# Patient Record
Sex: Female | Born: 1945 | Race: White | Hispanic: No | Marital: Single | Smoking: Former smoker
Health system: Southern US, Community
[De-identification: ages and names within clinical notes are randomized; demographics above are authoritative.]

---

## 2016-11-10 ENCOUNTER — Encounter: Payer: Medicare Other | Attending: Physician Assistant | Admitting: Physician Assistant

## 2016-11-10 DIAGNOSIS — I10 Essential (primary) hypertension: Secondary | ICD-10-CM | POA: Diagnosis not present

## 2016-11-10 DIAGNOSIS — L03031 Cellulitis of right toe: Secondary | ICD-10-CM | POA: Diagnosis not present

## 2016-11-10 DIAGNOSIS — Z87891 Personal history of nicotine dependence: Secondary | ICD-10-CM | POA: Diagnosis not present

## 2016-11-10 DIAGNOSIS — E11621 Type 2 diabetes mellitus with foot ulcer: Secondary | ICD-10-CM | POA: Insufficient documentation

## 2016-11-10 DIAGNOSIS — L97512 Non-pressure chronic ulcer of other part of right foot with fat layer exposed: Secondary | ICD-10-CM | POA: Insufficient documentation

## 2016-11-10 DIAGNOSIS — M1990 Unspecified osteoarthritis, unspecified site: Secondary | ICD-10-CM | POA: Insufficient documentation

## 2016-11-10 DIAGNOSIS — Z7984 Long term (current) use of oral hypoglycemic drugs: Secondary | ICD-10-CM | POA: Insufficient documentation

## 2016-11-10 DIAGNOSIS — E1140 Type 2 diabetes mellitus with diabetic neuropathy, unspecified: Secondary | ICD-10-CM | POA: Insufficient documentation

## 2016-11-11 NOTE — Progress Notes (Signed)
Theresa Jensen, Arrielle ([id number]) Visit Report for 11/10/2016 Abuse/Suicide Risk Screen Details Patient Name: Theresa Jensen, Ruben 11/10/2016 12:45 Date of Service: PM Medical Record [id number] Number: Patient Account Number: [id number] 1946-04-25 (71 y.o. Treating RN: Afful, RN, BSN, [place] [facility] Date of Birth/Sex: Female) Other Clinician: Primary Care Heleena Miceli: TOBIN, REBECCA Treating STONE III, HOYT Referring Alanys Godino: Ruffin PyoBIN, REBECCA Cedrik Heindl/Extender: Weeks in Treatment: 0 Abuse/Suicide Risk Screen Items Answer ABUSE/SUICIDE RISK SCREEN: Has anyone close to you tried to hurt or harm you recentlyo No Do you feel uncomfortable with anyone in your familyo No Has anyone forced you do things that you didnot want to doo No Do you have any thoughts of harming yourselfo No Patient displays signs or symptoms of abuse and/or neglect. No Electronic Signature(s) Signed: 11/10/2016 4:42:39 PM By: Elpidio EricAfful, Rita BSN, RN Entered By: Elpidio EricAfful, Rita on 11/10/2016 13:00:06 Theresa Jensen, Cesia ([id number]) -------------------------------------------------------------------------------- Activities of Daily Living Details Patient Name: Theresa Jensen, Zerenity 11/10/2016 12:45 Date of Service: PM Medical Record [id number] Number: Patient Account Number: [id number] 1946-04-25 (71 y.o. Treating RN: Clover MealyAfful, RN, BSN, [place] [facility] Date of Birth/Sex: Female) Other Clinician: Primary Care Byrdie Miyazaki: TOBIN, REBECCA Treating STONE III, HOYT Referring Nazareth Kirk: Ruffin PyoBIN, REBECCA Genita Nilsson/Extender: Weeks in Treatment: 0 Activities of Daily Living Items Answer Activities of Daily Living (Please select one for each item) Drive Automobile Completely Able Take Medications Completely Able Use Telephone Completely Able Care for Appearance Completely Able Use Toilet Completely Able Bath / Shower Completely Able Dress Self Completely Able Feed Self Completely Able Walk Completely Able Get In / Out Bed Completely Able Housework Completely Able Prepare  Meals Completely Able Handle Money Completely Able Shop for Self Completely Able Electronic Signature(s) Signed: 11/10/2016 4:42:39 PM By: Elpidio EricAfful, Rita BSN, RN Entered By: Elpidio EricAfful, Rita on 11/10/2016 13:00:26 Theresa Jensen, Tacie ([id number]) -------------------------------------------------------------------------------- Education Assessment Details Patient Name: Theresa Jensen, Reigna 11/10/2016 12:45 Date of Service: PM Medical Record [id number] Number: Patient Account Number: [id number] 1946-04-25 (70 y.o. Treating RN: Clover MealyAfful, RN, BSN, [place] [facility] Date of Birth/Sex: Female) Other Clinician: Primary Care Trinty Marken: TOBIN, REBECCA Treating STONE III, HOYT Referring Ammarie Matsuura: Ruffin PyoBIN, REBECCA Alter Moss/Extender: Weeks in Treatment: 0 Primary Learner Assessed: Patient Learning Preferences/Education Level/Primary Language Learning Preference: Explanation Highest Education Level: High School Preferred Language: English Cognitive Barrier Assessment/Beliefs Language Barrier: No Physical Barrier Assessment Impaired Vision: No Impaired Hearing: No Decreased Hand dexterity: No Knowledge/Comprehension Assessment Knowledge Level: High Comprehension Level: High Ability to understand written High instructions: Ability to understand verbal High instructions: Motivation Assessment Anxiety Level: Calm Cooperation: Cooperative Education Importance: Acknowledges Need Interest in Health Problems: Asks Questions Perception: Coherent Willingness to Engage in Self- High Management Activities: Readiness to Engage in Self- High Management Activities: Electronic Signature(s) Signed: 11/10/2016 4:42:39 PM By: Elpidio EricAfful, Rita BSN, RN Entered By: Elpidio EricAfful, Rita on 11/10/2016 13:00:44 Theresa Jensen, Carlinda ([id number]) Theresa Jensen, Sanam ([id number]) -------------------------------------------------------------------------------- Fall Risk Assessment Details Patient Name: Theresa Jensen, Ivori 11/10/2016 12:45 Date of  Service: PM Medical Record [id number] Number: Patient Account Number: [id number] 1946-04-25 (71 y.o. Treating RN: Afful, RN, BSN, [place] [facility] Date of Birth/Sex: Female) Other Clinician: Primary Care Larico Dimock: TOBIN, REBECCA Treating STONE III, HOYT Referring Magda Muise: Ruffin PyoBIN, REBECCA Shatara Stanek/Extender: Weeks in Treatment: 0 Fall Risk Assessment Items Have you had 2 or more falls in the last 12 monthso 0 No Have you had any fall that resulted in injury in the last 12 monthso 0 No FALL RISK ASSESSMENT: History of falling - immediate or within 3 months 0 No Secondary diagnosis 0 No Ambulatory aid None/bed rest/wheelchair/nurse 0 Yes Crutches/cane/walker 0 No Furniture 0 No IV Access/Saline Lock 0 No Gait/Training  Normal/bed rest/immobile 0 Yes Weak 0 No Impaired 0 No Mental Status Oriented to own ability 0 Yes Electronic Signature(s) Signed: 11/10/2016 4:42:39 PM By: Elpidio Eric BSN, RN Entered By: Elpidio Eric on 11/10/2016 13:00:57 Theresa Rosales ([id number]) -------------------------------------------------------------------------------- Foot Assessment Details Patient Name: SHAKILA, MAK 11/10/2016 12:45 Date of Service: PM Medical Record [id number] Number: Patient Account Number: [id number] Aug 04, 1945 (71 y.o. Treating RN: Afful, RN, BSN, [place] [facility] Date of Birth/Sex: Female) Other Clinician: Primary Care Khori Underberg: TOBIN, REBECCA Treating STONE III, HOYT Referring Clarke Peretz: Ruffin Pyo Laury Huizar/Extender: Weeks in Treatment: 0 Foot Assessment Items Site Locations + = Sensation present, - = Sensation absent, C = Callus, U = Ulcer R = Redness, W = Warmth, M = Maceration, PU = Pre-ulcerative lesion F = Fissure, S = Swelling, D = Dryness Assessment Right: Left: Other Deformity: No No Prior Foot Ulcer: No No Prior Amputation: No No Charcot Joint: No No Ambulatory Status: Ambulatory Without Help Gait: Steady Electronic Signature(s) Signed: 11/10/2016 4:42:39 PM By: Elpidio Eric BSN, RN Entered By: Elpidio Eric on 11/10/2016 13:01:49 Theresa Rosales ([id number]) -------------------------------------------------------------------------------- Nutrition Risk Assessment Details Patient Name: SADIA, BELFIORE 11/10/2016 12:45 Date of Service: PM Medical Record [id number] Number: Patient Account Number: [id number] 12/29/1945 (71 y.o. Treating RN: Clover Mealy, RN, BSN, [place] [facility] Date of Birth/Sex: Female) Other Clinician: Primary Care Jakiah Goree: TOBIN, REBECCA Treating STONE III, HOYT Referring Carrington Olazabal: Ruffin Pyo Raymonda Pell/Extender: Weeks in Treatment: 0 Height (in): Weight (lbs): Body Mass Index (BMI): Nutrition Risk Assessment Items NUTRITION RISK SCREEN: I have an illness or condition that made me change the kind and/or 0 No amount of food I eat I eat fewer than two meals per day 0 No I eat few fruits and vegetables, or milk products 0 No I have three or more drinks of beer, liquor or wine almost every day 0 No I have tooth or mouth problems that make it hard for me to eat 0 No I don't always have enough money to buy the food I need 0 No I eat alone most of the time 0 No I take three or more different prescribed or over-the-counter drugs a 0 No day Without wanting to, I have lost or gained 10 pounds in the last six 0 No months I am not always physically able to shop, cook and/or feed myself 0 No Nutrition Protocols Good Risk Protocol 0 No interventions needed Moderate Risk Protocol Electronic Signature(s) Signed: 11/10/2016 4:42:39 PM By: Elpidio Eric BSN, RN Entered By: Elpidio Eric on 11/10/2016 13:01:02

## 2016-11-12 NOTE — Progress Notes (Signed)
Theresa RosalesSUTTON, Persais ([id number]) Visit Report for 11/10/2016 Allergy List Details Patient Name: Theresa RosalesSUTTON, Galya Date of Service: 11/10/2016 12:45 PM Medical Record Number: [id number] Patient Account Number: [id number] Date of Birth/Sex: 10-16-1945 [age] y.o. Female) Treating RN: Afful, RN, BSN, [place] Sinkita Primary Care Denny Mccree: Ruffin PyoBIN, REBECCA Other Clinician: Referring Khyri Hinzman: Ruffin PyoBIN, REBECCA Treating Joeli Fenner/Extender: Linwood DibblesSTONE III, HOYT Weeks in Treatment: 0 Allergies Active Allergies No known allergies Allergy Notes Electronic Signature(s) Signed: 11/10/2016 4:42:39 PM By: Elpidio EricAfful, Rita BSN, RN Entered By: Elpidio EricAfful, Rita on 11/10/2016 13:09:16 Theresa RosalesSUTTON, Kenadee ([id number]) -------------------------------------------------------------------------------- Arrival Information Details Patient Name: Theresa RosalesSUTTON, Elizet Date of Service: 11/10/2016 12:45 PM Medical Record Number: [id number] Patient Account Number: [id number] Date of Birth/Sex: 10-16-1945 [age] y.o. Female) Treating RN: Afful, RN, BSN, [place] Sinkita Primary Care Godric Lavell: Ruffin PyoBIN, REBECCA Other Clinician: Referring Dannia Snook: Ruffin PyoBIN, REBECCA Treating Lunden Stieber/Extender: Linwood DibblesSTONE III, HOYT Weeks in Treatment: 0 Visit Information Patient Arrived: Ambulatory Arrival Time: 12:57 Accompanied By: self Transfer Assistance: None Patient Identification Verified: Yes Secondary Verification Process Yes Completed: Patient Requires Transmission- No Based Precautions: Patient Has Alerts: Yes Patient Alerts: Patient on Blood Thinner Hgb A1C 8.2 (may) Electronic Signature(s) Signed: 11/10/2016 4:42:39 PM By: Elpidio EricAfful, Rita BSN, RN Entered By: Elpidio EricAfful, Rita on 11/10/2016 13:41:10 Theresa RosalesSUTTON, Keltie ([id number]) -------------------------------------------------------------------------------- Clinic Level of Care Assessment Details Patient Name: Theresa RosalesSUTTON, Latera Date of Service: 11/10/2016 12:45 PM Medical Record Number: [id number] Patient Account Number: [id number] Date of  Birth/Sex: 10-16-1945 (70 y.o. Female) Treating RN: Afful, RN, BSN,  Sinkita Primary Care Jamahl Lemmons: Ruffin PyoBIN, REBECCA Other Clinician: Referring Fraser Busche: Ruffin PyoBIN, REBECCA Treating Aerika Groll/Extender: Linwood DibblesSTONE III, HOYT Weeks in Treatment: 0 Clinic Level of Care Assessment Items TOOL 1 Quantity Score []  - Use when EandM and Procedure is performed on INITIAL visit 0 ASSESSMENTS - Nursing Assessment / Reassessment X - General Physical Exam (combine w/ comprehensive assessment (listed just 1 20 below) when performed on new pt. evals) X - Comprehensive Assessment (HX, ROS, Risk Assessments, Wounds Hx, etc.) 1 25 ASSESSMENTS - Wound and Skin Assessment / Reassessment []  - Dermatologic / Skin Assessment (not related to wound area) 0 ASSESSMENTS - Ostomy and/or Continence Assessment and Care []  - Incontinence Assessment and Management 0 []  - Ostomy Care Assessment and Management (repouching, etc.) 0 PROCESS - Coordination of Care X - Simple Patient / Family Education for ongoing care 1 15 []  - Complex (extensive) Patient / Family Education for ongoing care 0 X - Staff obtains Consents, Records, Test Results / Process Orders 1 10 []  - Staff telephones HHA, Nursing Homes / Clarify orders / etc 0 []  - Routine Transfer to another Facility (non-emergent condition) 0 []  - Routine Hospital Admission (non-emergent condition) 0 X - New Admissions / Manufacturing engineernsurance Authorizations / Ordering NPWT, Apligraf, etc. 1 15 []  - Emergency Hospital Admission (emergent condition) 0 PROCESS - Special Needs []  - Pediatric / Minor Patient Management 0 []  - Isolation Patient Management 0 Cale, Gavin PoundEBORAH ([id number]) []  - Hearing / Language / Visual special needs 0 []  - Assessment of Community assistance (transportation, D/C planning, etc.) 0 []  - Additional assistance / Altered mentation 0 []  - Support Surface(s) Assessment (bed, cushion, seat, etc.) 0 INTERVENTIONS - Miscellaneous []  - External ear exam 0 []  - Patient Transfer  (multiple staff / Nurse, adultHoyer Lift / Similar devices) 0 []  - Simple Staple / Suture removal (25 or less) 0 []  - Complex Staple / Suture removal (26 or more) 0 []  - Hypo/Hyperglycemic Management (do not check if billed separately) 0 X - Ankle / Brachial Index (ABI) - do not check if billed separately  1 15 Has the patient been seen at the hospital within the last three years: Yes Total Score: 100 Level Of Care: New/Established - Level 3 Electronic Signature(s) Signed: 11/10/2016 4:42:39 PM By: Elpidio Eric BSN, RN Entered By: Elpidio Eric on 11/10/2016 13:42:51 Theresa Jensen ([id number]) -------------------------------------------------------------------------------- Encounter Discharge Information Details Patient Name: Theresa Jensen Date of Service: 11/10/2016 12:45 PM Medical Record Number: [id number] Patient Account Number: [id number] Date of Birth/Sex: [birth year] (71 y.o. Female) Treating RN: Afful, RN, BSN, [place] Sink Primary Care Destynee Stringfellow: Ruffin Pyo Other Clinician: Referring Brodin Gelpi: Ruffin Pyo Treating Aerion Bagdasarian/Extender: Linwood Dibbles, HOYT Weeks in Treatment: 0 Encounter Discharge Information Items Discharge Pain Level: 0 Discharge Condition: Stable Ambulatory Status: Ambulatory Discharge Destination: Home Transportation: Private Auto Accompanied By: self Schedule Follow-up Appointment: No Medication Reconciliation completed and provided to Patient/Care No Zahriyah Joo: Provided on Clinical Summary of Care: 11/10/2016 Form Type Recipient Paper Patient DS Electronic Signature(s) Signed: 11/10/2016 4:42:39 PM By: Elpidio Eric BSN, RN Previous Signature: 11/10/2016 2:15:40 PM Version By: Gwenlyn Perking Entered By: Elpidio Eric on 11/10/2016 14:16:23 Theresa Jensen ([id number]) -------------------------------------------------------------------------------- Lower Extremity Assessment Details Patient Name: Theresa Jensen Date of Service: 11/10/2016 12:45 PM Medical Record Number:  [id number] Patient Account Number: [id number] Date of Birth/Sex: 1945/10/15 (71 y.o. Female) Treating RN: Afful, RN, BSN, Psychologist, clinical Primary Care Zain Bingman: Ruffin Pyo Other Clinician: Referring Chontel Warning: Ruffin Pyo Treating Kenleigh Toback/Extender: Linwood Dibbles, HOYT Weeks in Treatment: 0 Edema Assessment Assessed: [Left: No] [Right: No] E[Left: dema] [Right: :] Calf Left: Right: Point of Measurement: 37 cm From Medial Instep 32 cm 32 cm Ankle Left: Right: Point of Measurement: 9 cm From Medial Instep 18 cm 18.2 cm Vascular Assessment Claudication: Claudication Assessment [Left:None] [Right:None] Pulses: Dorsalis Pedis Palpable: [Left:Yes] [Right:Yes] Doppler Audible: [Left:Yes] [Right:Yes] Posterior Tibial Doppler Audible: [Left:Yes] [Right:Yes] Popliteal Palpable: [Left:Yes] [Right:Yes] Extremity colors, hair growth, and conditions: Extremity Color: [Left:Normal] [Right:Normal] Hair Growth on Extremity: [Left:Yes] [Right:Yes] Temperature of Extremity: [Left:Warm] [Right:Warm] Capillary Refill: [Left:< 3 seconds] [Right:< 3 seconds] Blood Pressure: Brachial: [Left:99] [Right:97] Dorsalis Pedis: 96 [Left:Dorsalis Pedis: 99] Ankle: Posterior Tibial: 89 [Left:Posterior Tibial: 88 0.97] [Right:1.00] Toe Nail Assessment Left: Right: Thick: Yes Yes Arista, [name] ([id number]) Discolored: Yes Yes Deformed: No No Improper Length and Hygiene: No No Electronic Signature(s) Signed: 11/10/2016 4:42:39 PM By: Elpidio Eric BSN, RN Entered By: Elpidio Eric on 11/10/2016 13:22:12 Theresa Jensen ([id number]) -------------------------------------------------------------------------------- Multi Wound Chart Details Patient Name: Theresa Jensen Date of Service: 11/10/2016 12:45 PM Medical Record Number: [id number] Patient Account Number: [id number] Date of Birth/Sex: December 17, 1945 (71 y.o. Female) Treating RN: Afful, RN, BSN, [place] Sink Primary Care Javae Braaten: Ruffin Pyo Other  Clinician: Referring Zyron Deeley: Ruffin Pyo Treating Ziare Orrick/Extender: Linwood Dibbles, HOYT Weeks in Treatment: 0 Vital Signs Height(in): Pulse(bpm): 92 Weight(lbs): Blood Pressure 91/49 (mmHg): Body Mass Index(BMI): Temperature(F): 98.2 Respiratory Rate 16 (breaths/min): Photos: [1:No Photos] [N/A:N/A] Wound Location: [1:Right Toe Great] [N/A:N/A] Wounding Event: [1:Gradually Appeared] [N/A:N/A] Primary Etiology: [1:Diabetic Wound/Ulcer of N/A the Lower Extremity] Comorbid History: [1:Anemia, Hypertension, Type II Diabetes, Osteoarthritis, Neuropathy] [N/A:N/A] Date Acquired: [1:07/06/2016] [N/A:N/A] Weeks of Treatment: [1:0] [N/A:N/A] Wound Status: [1:Open] [N/A:N/A] Measurements L x W x D 1.3x0.4x0.5 [N/A:N/A] (cm) Area (cm) : [1:0.408] [N/A:N/A] Volume (cm) : [1:0.204] [N/A:N/A] Classification: [1:Grade 1] [N/A:N/A] Wound Margin: [1:Distinct, outline attached N/A] Granulation Amount: [1:Large (67-100%)] [N/A:N/A] Granulation Quality: [1:Pink, Pale] [N/A:N/A] Necrotic Amount: [1:None Present (0%)] [N/A:N/A] Exposed Structures: [1:Fat Layer (Subcutaneous N/A Tissue) Exposed: Yes Fascia: No Tendon: No Muscle: No Joint: No Bone: No] Epithelialization: [1:None] [N/A:N/A] Periwound Skin Texture: Callus: Yes [1:Excoriation: No] [N/A:N/A] Induration: No  Crepitus: No Rash: No Scarring: No Periwound Skin Maceration: No N/A N/A Moisture: Dry/Scaly: No Periwound Skin Color: Atrophie Blanche: No N/A N/A Cyanosis: No Ecchymosis: No Erythema: No Hemosiderin Staining: No Mottled: No Pallor: No Rubor: No Temperature: No Abnormality N/A N/A Tenderness on No N/A N/A Palpation: Wound Preparation: Ulcer Cleansing: N/A N/A Rinsed/Irrigated with Saline Topical Anesthetic Applied: Other: lidocaine 4% Treatment Notes Electronic Signature(s) Signed: 11/10/2016 4:42:39 PM By: Elpidio Eric BSN, RN Entered By: Elpidio Eric on 11/10/2016 13:43:10 Theresa Jensen  ([id number]) -------------------------------------------------------------------------------- Multi-Disciplinary Care Plan Details Patient Name: Theresa Jensen Date of Service: 11/10/2016 12:45 PM Medical Record Number: [id number] Patient Account Number: [id number] Date of Birth/Sex: 1945/10/20 (71 y.o. Female) Treating RN: Afful, RN, BSN, [place] Sink Primary Care Kamilo Och: Ruffin Pyo Other Clinician: Referring Aliscia Clayton: Ruffin Pyo Treating Sohrab Keelan/Extender: Linwood Dibbles, HOYT Weeks in Treatment: 0 Active Inactive ` Orientation to the Wound Care Program Nursing Diagnoses: Knowledge deficit related to the wound healing center program Goals: Patient/caregiver will verbalize understanding of the Wound Healing Center Program Date Initiated: 11/10/2016 Target Resolution Date: 03/13/2017 Goal Status: Active Interventions: Provide education on orientation to the wound center Notes: ` Peripheral Neuropathy Nursing Diagnoses: Knowledge deficit related to disease process and management of peripheral neurovascular dysfunction Potential alteration in peripheral tissue perfusion (select prior to confirmation of diagnosis) Goals: Patient/caregiver will verbalize understanding of disease process and disease management Date Initiated: 11/10/2016 Target Resolution Date: 03/13/2017 Goal Status: Active Interventions: Assess signs and symptoms of neuropathy upon admission and as needed Provide education on Management of Neuropathy and Related Ulcers Provide education on Management of Neuropathy upon discharge from the Wound Center Notes: ` Pressure Kenneth, [name] ([id number]) Nursing Diagnoses: Knowledge deficit related to management of pressures ulcers Potential for impaired tissue integrity related to pressure, friction, moisture, and shear Goals: Patient will remain free from development of additional pressure ulcers Date Initiated: 11/10/2016 Target Resolution Date: 03/13/2017 Goal  Status: Active Patient will remain free of pressure ulcers Date Initiated: 11/10/2016 Target Resolution Date: 03/13/2017 Goal Status: Active Patient/caregiver will verbalize risk factors for pressure ulcer development Date Initiated: 11/10/2016 Target Resolution Date: 03/13/2017 Goal Status: Active Patient/caregiver will verbalize understanding of pressure ulcer management Date Initiated: 11/10/2016 Target Resolution Date: 03/13/2017 Goal Status: Active Interventions: Assess: immobility, friction, shearing, incontinence upon admission and as needed Assess offloading mechanisms upon admission and as needed Assess potential for pressure ulcer upon admission and as needed Provide education on pressure ulcers Treatment Activities: Patient referred for home evaluation of offloading devices/mattresses : 11/10/2016 Patient referred for pressure reduction/relief devices : 11/10/2016 Notes: ` Wound/Skin Impairment Nursing Diagnoses: Impaired tissue integrity Knowledge deficit related to ulceration/compromised skin integrity Goals: Patient/caregiver will verbalize understanding of skin care regimen Date Initiated: 11/10/2016 Target Resolution Date: 03/13/2017 Goal Status: Active Ulcer/skin breakdown will have a volume reduction of 30% by week 4 Date Initiated: 11/10/2016 Target Resolution Date: 03/13/2017 Goal Status: Active Ulcer/skin breakdown will have a volume reduction of 50% by week 8 CRYSTALMARIE, YASIN ([id number]) Date Initiated: 11/10/2016 Target Resolution Date: 03/13/2017 Goal Status: Active Ulcer/skin breakdown will have a volume reduction of 80% by week 12 Date Initiated: 11/10/2016 Target Resolution Date: 03/13/2017 Goal Status: Active Ulcer/skin breakdown will heal within 14 weeks Date Initiated: 11/10/2016 Target Resolution Date: 03/13/2017 Goal Status: Active Interventions: Assess patient/caregiver ability to obtain necessary supplies Assess patient/caregiver ability to  perform ulcer/skin care regimen upon admission and as needed Assess ulceration(s) every visit Provide education on ulcer and skin care Treatment Activities: Referred to DME Naomia Lenderman for dressing supplies :  11/10/2016 Skin care regimen initiated : 11/10/2016 Topical wound management initiated : 11/10/2016 Notes: Electronic Signature(s) Signed: 11/10/2016 4:42:39 PM By: Elpidio Eric BSN, RN Entered By: Elpidio Eric on 11/10/2016 13:47:23 Theresa Jensen ([id number]) -------------------------------------------------------------------------------- Pain Assessment Details Patient Name: Theresa Jensen Date of Service: 11/10/2016 12:45 PM Medical Record Number: [id number] Patient Account Number: [id number] Date of Birth/Sex: [birth year] (71 y.o. Female) Treating RN: Afful, RN, BSN, [place] Sink Primary Care Harley Fitzwater: Ruffin Pyo Other Clinician: Referring Malon Branton: Ruffin Pyo Treating Sarkis Rhines/Extender: Linwood Dibbles, HOYT Weeks in Treatment: 0 Active Problems Location of Pain Severity and Description of Pain Patient Has Paino No Site Locations With Dressing Change: No Pain Management and Medication Current Pain Management: Electronic Signature(s) Signed: 11/10/2016 4:42:39 PM By: Elpidio Eric BSN, RN Entered By: Elpidio Eric on 11/10/2016 12:59:54 Theresa Jensen ([id number]) -------------------------------------------------------------------------------- Patient/Caregiver Education Details Patient Name: Theresa Jensen Date of Service: 11/10/2016 12:45 PM Medical Record Number: [id number] Patient Account Number: [id number] Date of Birth/Gender: 1945/07/18 (71 y.o. Female) Treating RN: Afful, RN, BSN, [place] Sink Primary Care Physician: Ruffin Pyo Other Clinician: Referring Physician: Ruffin Pyo Treating Physician/Extender: Skeet Simmer in Treatment: 0 Education Assessment Education Provided To: Patient Education Topics Provided Peripheral Neuropathy: Methods:  Explain/Verbal Responses: State content correctly Pressure: Methods: Explain/Verbal Responses: State content correctly Welcome To The Wound Care Center: Methods: Explain/Verbal Responses: State content correctly Wound/Skin Impairment: Methods: Explain/Verbal Responses: State content correctly Electronic Signature(s) Signed: 11/10/2016 4:42:39 PM By: Elpidio Eric BSN, RN Entered By: Elpidio Eric on 11/10/2016 14:16:42 Theresa Jensen ([id number]) -------------------------------------------------------------------------------- Wound Assessment Details Patient Name: Theresa Jensen Date of Service: 11/10/2016 12:45 PM Medical Record Number: [id number] Patient Account Number: [id number] Date of Birth/Sex: 08/05/1945 (71 y.o. Female) Treating RN: Afful, RN, BSN, Psychologist, clinical Primary Care Avin Gibbons: Ruffin Pyo Other Clinician: Referring Norina Cowper: Ruffin Pyo Treating Adelyna Brockman/Extender: Linwood Dibbles, HOYT Weeks in Treatment: 0 Wound Status Wound Number: 1 Primary Diabetic Wound/Ulcer of the Lower Etiology: Extremity Wound Location: Right Toe Great Wound Open Wounding Event: Gradually Appeared Status: Date Acquired: 07/06/2016 Comorbid Anemia, Hypertension, Type II Weeks Of Treatment: 0 History: Diabetes, Osteoarthritis, Neuropathy Clustered Wound: No Wound Measurements Length: (cm) 1.3 Width: (cm) 0.4 Depth: (cm) 0.5 Area: (cm) 0.408 Volume: (cm) 0.204 % Reduction in Area: % Reduction in Volume: Epithelialization: None Tunneling: No Undermining: No Wound Description Classification: Grade 1 Wound Margin: Distinct, outline attached Foul Odor After Cleansing: No Slough/Fibrino No Wound Bed Granulation Amount: Large (67-100%) Exposed Structure Granulation Quality: Pink, Pale Fascia Exposed: No Necrotic Amount: None Present (0%) Fat Layer (Subcutaneous Tissue) Exposed: Yes Tendon Exposed: No Muscle Exposed: No Joint Exposed: No Bone Exposed: No Periwound Skin  Texture Texture Color No Abnormalities Noted: No No Abnormalities Noted: No Callus: Yes Atrophie Blanche: No Crepitus: No Cyanosis: No Excoriation: No Ecchymosis: No Induration: No Erythema: No Rash: No Hemosiderin Staining: No Scarring: No Mottled: No Pallor: No Moisture Okray, Nyjai ([id number]) No Abnormalities Noted: No Rubor: No Dry / Scaly: No Temperature / Pain Maceration: No Temperature: No Abnormality Wound Preparation Ulcer Cleansing: Rinsed/Irrigated with Saline Topical Anesthetic Applied: Other: lidocaine 4%, Treatment Notes Wound #1 (Right Toe Great) 1. Cleansed with: Clean wound with Normal Saline 4. Dressing Applied: Prisma Ag 5. Secondary Dressing Applied Gauze and Kerlix/Conform 6. Footwear/Offloading device applied Wedge shoe 7. Secured with Tape Notes front offload Electronic Signature(s) Signed: 11/10/2016 4:42:39 PM By: Elpidio Eric BSN, RN Entered By: Elpidio Eric on 11/10/2016 13:16:55 Theresa Jensen ([id number]) -------------------------------------------------------------------------------- Vitals Details Patient Name: Theresa Jensen Date of Service: 11/10/2016 12:45 PM Medical Record Number: [id number] Patient  Account Number: [id number] Date of Birth/Sex: [birth year] (71 y.o. Female) Treating RN: Afful, RN, BSN, [place] Sink Primary Care Harjas Biggins: Ruffin Pyo Other Clinician: Referring Nuria Phebus: Ruffin Pyo Treating Anie Juniel/Extender: Linwood Dibbles, HOYT Weeks in Treatment: 0 Vital Signs Time Taken: 13:09 Temperature (F): 98.2 Pulse (bpm): 92 Respiratory Rate (breaths/min): 16 Blood Pressure (mmHg): 91/49 Reference Range: 80 - 120 mg / dl Electronic Signature(s) Signed: 11/10/2016 4:42:39 PM By: Elpidio Eric BSN, RN Entered By: Elpidio Eric on 11/10/2016 13:09:51

## 2016-11-12 NOTE — Progress Notes (Signed)
Theresa, Jensen ([id number]) Visit Report for 11/10/2016 Chief Complaint Document Details Patient Name: Theresa Jensen, Theresa Jensen 11/10/2016 12:45 Date of Service: PM Medical Record [id number] Number: Patient Account Number: [id number] [birth year] (71 y.o. Treating RN: Clover Mealy, RN, BSN, [place] Sink Date of Birth/Sex: Female) Other Clinician: Primary Care Provider: TOBIN, REBECCA Treating STONE III, Klaryssa Fauth Referring Provider: Ruffin Pyo Provider/Extender: Tania Ade in Treatment: 0 Information Obtained from: Patient Chief Complaint Right Great Toe Ulcer Electronic Signature(s) Signed: 11/11/2016 10:10:39 AM By: Lenda Kelp PA-C Entered By: Lenda Kelp on 11/11/2016 08:17:07 [name] ([id number]) -------------------------------------------------------------------------------- Debridement Details Patient Name: Theresa, Jensen 11/10/2016 12:45 Date of Service: PM Medical Record [id number] Number: Patient Account Number: [id number] [birth year] (70 y.o. Treating RN: Afful, RN, BSN, [place] Sink Date of Birth/Sex: Female) Other Clinician: Primary Care Provider: TOBIN, REBECCA Treating STONE III, Daquana Paddock Referring Provider: Ruffin Pyo Provider/Extender: Weeks in Treatment: 0 Debridement Performed for Wound #1 Right Toe Great Assessment: Performed By: Physician STONE III, Torrence Hammack E., PA-C Debridement: Debridement Severity of Tissue Pre Fat layer exposed Debridement: Pre-procedure Verification/Time Out Yes - 13:45 Taken: Start Time: 13:45 Pain Control: Lidocaine 4% Topical Solution Level: Skin/Subcutaneous Tissue Total Area Debrided (L x 1.3 (cm) x 0.4 (cm) = 0.52 (cm) W): Tissue and other Non-Viable, Callus, Exudate, Fat, Fibrin/Slough, Subcutaneous material debrided: Instrument: Curette Bleeding: Moderate Hemostasis Achieved: Silver Nitrate End Time: 13:55 Procedural Pain: Insensate Post Procedural Pain: Insensate Response to Treatment: Procedure was tolerated well Post Debridement  Measurements of Total Wound Length: (cm) 1.3 Width: (cm) 0.6 Depth: (cm) 0.5 Volume: (cm) 0.306 Character of Wound/Ulcer Post Stable Debridement: Severity of Tissue Post Debridement: Fat layer exposed Post Procedure Diagnosis Same as Pre-procedure Electronic Signature(s) Clinton, [name] ([id number]) Signed: 11/11/2016 10:10:39 AM By: Lenda Kelp PA-C Signed: 11/11/2016 11:52:37 AM By: Elpidio Eric BSN, RN Previous Signature: 11/10/2016 4:42:39 PM Version By: Elpidio Eric BSN, RN Entered By: Lenda Kelp on 11/11/2016 08:08:19 [name] ([id number]) -------------------------------------------------------------------------------- HPI Details Patient Name: [name], Jensen 11/10/2016 12:45 Date of Service: PM Medical Record [id number] Number: Patient Account Number: [id number] May 12, 1945 (71 y.o. Treating RN: Afful, RN, BSN, [place] Sink Date of Birth/Sex: Female) Other Clinician: Primary Care Provider: TOBIN, REBECCA Treating STONE III, Meggie Laseter Referring Provider: Ruffin Pyo Provider/Extender: Weeks in Treatment: 0 History of Present Illness HPI Description: 11/10/16 patient presents for evaluation as referral from her primary care provider, Ruffin Pyo, MD who has been treating her right great toe wound. Unfortunately she has had issues with diabetic control and they are working on that. Nonetheless her hemoglobin A1c on last check which was April 2018 with 8.2. She did have an x-ray which was performed and evaluated by her primary on 09/07/16 which is commented in the note to show no bone involvement. I'll see this is good news. Mainly she has been performing wet to dry packing of the wound and on the Sep 07, 2016 encounter it was noted that her primary did pair down some of the callus surrounding the wound. However no excisional debridement of the wound margins especially where it is immediately undermining and rolled is noted. She has been on two rounds of antibiotics  clindamycin and Cipro fortunately there does not appear to be any sign of infection at this point. Electronic Signature(s) Signed: 11/11/2016 10:10:39 AM By: Lenda Kelp PA-C Entered By: Lenda Kelp on 11/11/2016 08:17:31 [name] ([id number]) -------------------------------------------------------------------------------- Physical Exam Details Patient Name: Theresa, Jensen 11/10/2016 12:45 Date of Service: PM Medical Record [id number] Number: Patient Account Number: [id number] 1945-07-18 (71 y.o. Treating RN: Afful,  RN, BSN, [place] Sink Date of Birth/Sex: Female) Other Clinician: Primary Care Provider: TOBIN, REBECCA Treating STONE III, Audreana Hancox Referring Provider: Ruffin Pyo Provider/Extender: Weeks in Treatment: 0 Constitutional sitting or standing blood pressure is within target range for patient.. pulse regular and within target range for patient.Marland Kitchen respirations regular, non-labored and within target range for patient.Marland Kitchen temperature within target range for patient.. Well-nourished and well-hydrated in no acute distress. Eyes conjunctiva clear no eyelid edema noted. pupils equal round and reactive to light and accommodation. Ears, Nose, Mouth, and Throat no gross abnormality of ear auricles or external auditory canals. normal hearing noted during conversation. mucus membranes moist. Respiratory normal breathing without difficulty. clear to auscultation bilaterally. Cardiovascular regular rate and rhythm with normal S1, S2. 2+ dorsalis pedis/posterior tibialis pulses. no clubbing, cyanosis, significant edema, <3 sec cap refill. Gastrointestinal (GI) soft, non-tender, non-distended, +BS. no ventral hernia noted. Musculoskeletal normal gait and posture. no significant deformity or arthritic changes, no loss or range of motion, no clubbing. Psychiatric this patient is able to make decisions and demonstrates good insight into disease process. Alert and Oriented x 3.  pleasant and cooperative. Notes On evaluation patient's right great toe wound appears to have a fairly decent granulation bed with minimal slough noted. However the medial edge of the wound is rolled under and forming somewhat of a small pocket which I think is going to call difficulty in healing. She has had some callous pairing and there is callus surrounding the wound as well but no excisional debridement as far as the wound margins are concerned. I discussed with her today that I think sharp debridement to level out the side and helped to remove the rolled edge will be of benefit in order to help this wound heal appropriately. She is in agreement with this plan and she tolerated the debridement well without complication or pain mostly secondary to her neuropathy with good result. Electronic Signature(s) Signed: 11/11/2016 10:10:39 AM By: Billey Gosling, [place] ([id number]) Entered By: Lenda Kelp on 11/11/2016 08:31:39 [name] ([id number]) -------------------------------------------------------------------------------- Physician Orders Details Patient Name: LONDYN, HOTARD 11/10/2016 12:45 Date of Service: PM Medical Record [id number] Number: Patient Account Number: [id number] Jan 09, 1946 (71 y.o. Treating RN: Afful, RN, BSN, [place] Sink Date of Birth/Sex: Female) Other Clinician: Primary Care Provider: TOBIN, REBECCA Treating STONE III, Lutie Pickler Referring Provider: Ruffin Pyo Provider/Extender: Weeks in Treatment: 0 Verbal / Phone Orders: No Diagnosis Coding ICD-10 Coding Code Description E11.621 Type 2 diabetes mellitus with foot ulcer L97.512 Non-pressure chronic ulcer of other part of right foot with fat layer exposed E11.40 Type 2 diabetes mellitus with diabetic neuropathy, unspecified L03.031 Cellulitis of right toe Wound Cleansing Wound #1 Right Toe Great o Cleanse wound with mild soap and water o May Shower, gently pat wound dry prior to applying  new dressing. o May shower with protection. Anesthetic Wound #1 Right Toe Great o Topical Lidocaine 4% cream applied to wound bed prior to debridement - in clinic Primary Wound Dressing Wound #1 Right Toe Great o Prisma Ag Secondary Dressing Wound #1 Right Toe Great o Gauze and Kerlix/Conform Dressing Change Frequency Wound #1 Right Toe Great o Change dressing every other day. Follow-up Appointments Wound #1 Right Toe Great o Return Appointment in 1 week. Waucoma, [name] ([id number]) Off-Loading Wound #1 Right Toe Great o Open toe surgical shoe with peg assist. Additional Orders / Instructions Wound #1 Right Toe Great o Increase protein intake. o Activity as tolerated Notes I am going to initiate a silver alginate dressing  for treatment over the next week. Otherwise we will start with the other above wound care orders during this time as well per above. We will see were things stand in one weeks time although I explained to patient today the wound bed look excellent post debridement and with appropriate offloading with the front offload her shoe I think she is going to do very well. She was advised aware this any time she is up and walking around even inside her home. We will see her in one week for reevaluation. Electronic Signature(s) Signed: 11/11/2016 10:10:39 AM By: Lenda Kelp PA-C Previous Signature: 11/10/2016 4:42:39 PM Version By: Elpidio Eric BSN, RN Entered By: Lenda Kelp on 11/11/2016 08:33:04 [name] ([id number]) -------------------------------------------------------------------------------- Problem List Details Patient Name: TONEE, SILVERSTEIN 11/10/2016 12:45 Date of Service: PM Medical Record [id number] Number: Patient Account Number: [id number] 07-May-1945 (70 y.o. 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-pressure chronic ulcer of other part of right foot with 11/11/2016 Yes fat layer exposed E11.40 Type 2 diabetes mellitus with diabetic neuropathy, 11/11/2016 Yes unspecified L03.031 Cellulitis of right toe 11/11/2016 Yes Inactive Problems Resolved Problems Electronic Signature(s) Signed: 11/11/2016 10:10:39 AM By: Lenda Kelp PA-C Entered By: Lenda Kelp on 11/11/2016 08:13:27 [name] ([id number]) -------------------------------------------------------------------------------- Progress Note Details Patient Name: VINCENT, EHRLER 11/10/2016 12:45 Date of Service: PM Medical Record [id number] Number: Patient Account Number: [id number] 06-03-1945 (71 y.o. Treating RN: Afful, RN, BSN, [place] Sink Date of Birth/Sex: Female) Other Clinician: Primary Care Provider: TOBIN, REBECCA Treating STONE III, Eyden Dobie Referring Provider: Ruffin Pyo Provider/Extender: Weeks in Treatment: 0 Subjective Chief Complaint Information obtained from Patient Right Great Toe Ulcer History of Present Illness (HPI) 11/10/16 patient presents for evaluation as referral from her primary care provider, Ruffin Pyo, MD who has been treating her right great toe wound. Unfortunately she has had issues with diabetic control and they are working on that. Nonetheless her hemoglobin A1c on last check which was April 2018 with 8.2. She did have an x-ray which was performed and evaluated by her primary on 09/07/16 which is commented in the note to show no bone involvement. I'll see this is good news. Mainly she has been performing wet to dry packing of the wound and on the Sep 07, 2016 encounter it was noted that her primary did pair down some of the callus surrounding the wound. However no excisional debridement  of the wound margins especially where it is immediately undermining and rolled is noted. She has been on two rounds of antibiotics clindamycin and Cipro fortunately there does not appear to be any sign of infection at this point. Wound History Patient presents with 1 open wound that has been present for approximately 4 month. Patient has been treating wound in the following manner: neoporin. Laboratory tests have not been performed in the last month. Patient reportedly has not tested positive for an antibiotic resistant organism. Patient reportedly has not tested positive for osteomyelitis. Patient reportedly has not had testing performed to evaluate circulation in the legs. Patient History Information obtained from Patient. Allergies No known allergies Family History Diabetes - Father, No family history of Cancer, Heart Disease, Hereditary Spherocytosis, Hypertension, Kidney Disease, Lung Disease, Seizures, Stroke, Thyroid Problems, Tuberculosis. Social History ALAINE, LOUGHNEY ([id number])  Former smoker - quit 52yrs, Marital Status - Single, Alcohol Use - Rarely, Drug Use - No History, Caffeine Use - Moderate. Medical History Eyes Denies history of Cataracts, Glaucoma, Optic Neuritis Ear/Nose/Mouth/Throat Denies history of Chronic sinus problems/congestion, Middle ear problems Hematologic/Lymphatic Patient has history of Anemia Denies history of Hemophilia, Human Immunodeficiency Virus, Lymphedema, Sickle Cell Disease Respiratory Denies history of Aspiration, Asthma, Chronic Obstructive Pulmonary Disease (COPD), Pneumothorax, Sleep Apnea, Tuberculosis Cardiovascular Patient has history of Hypertension Gastrointestinal Denies history of Cirrhosis , Colitis, Crohn s, Hepatitis A, Hepatitis B, Hepatitis C Endocrine Patient has history of Type II Diabetes Genitourinary Denies history of End Stage Renal Disease Immunological Denies history of Lupus Erythematosus, Raynaud s,  Scleroderma Integumentary (Skin) Denies history of History of Burn, History of pressure wounds Musculoskeletal Patient has history of Osteoarthritis Neurologic Patient has history of Neuropathy Oncologic Denies history of Received Chemotherapy, Received Radiation Psychiatric Denies history of Anorexia/bulimia, Confinement Anxiety Patient is treated with Oral Agents. Blood sugar is not tested. Review of Systems (ROS) Constitutional Symptoms (General Health) The patient has no complaints or symptoms. Eyes The patient has no complaints or symptoms. Ear/Nose/Mouth/Throat The patient has no complaints or symptoms. Hematologic/Lymphatic The patient has no complaints or symptoms. Respiratory The patient has no complaints or symptoms. Cardiovascular The patient has no complaints or symptoms. Gastrointestinal DORY, DEMONT ([id number]) The patient has no complaints or symptoms. Endocrine The patient has no complaints or symptoms. Genitourinary The patient has no complaints or symptoms. Immunological The patient has no complaints or symptoms. Integumentary (Skin) Complains or has symptoms of Wounds. Musculoskeletal The patient has no complaints or symptoms. Neurologic Complains or has symptoms of Numbness/parasthesias. Oncologic The patient has no complaints or symptoms. Psychiatric The patient has no complaints or symptoms. Objective Constitutional sitting or standing blood pressure is within target range for patient.. pulse regular and within target range for patient.Marland Kitchen respirations regular, non-labored and within target range for patient.Marland Kitchen temperature within target range for patient.. Well-nourished and well-hydrated in no acute distress. Vitals Time Taken: 1:09 PM, Temperature: 98.2 F, Pulse: 92 bpm, Respiratory Rate: 16 breaths/min, Blood Pressure: 91/49 mmHg. Eyes conjunctiva clear no eyelid edema noted. pupils equal round and reactive to light and  accommodation. Ears, Nose, Mouth, and Throat no gross abnormality of ear auricles or external auditory canals. normal hearing noted during conversation. mucus membranes moist. Respiratory normal breathing without difficulty. clear to auscultation bilaterally. Cardiovascular regular rate and rhythm with normal S1, S2. 2+ dorsalis pedis/posterior tibialis pulses. no clubbing, cyanosis, significant edema, Reddy, Gerene ([id number]) Gastrointestinal (GI) soft, non-tender, non-distended, +BS. no ventral hernia noted. Musculoskeletal normal gait and posture. no significant deformity or arthritic changes, no loss or range of motion, no clubbing. Psychiatric this patient is able to make decisions and demonstrates good insight into disease process. Alert and Oriented x 3. pleasant and cooperative. General Notes: On evaluation patient's right great toe wound appears to have a fairly decent granulation bed with minimal slough noted. However the medial edge of the wound is rolled under and forming somewhat of a small pocket which I think is going to call difficulty in healing. She has had some callous pairing and there is callus surrounding the wound as well but no excisional debridement as far as the wound margins are concerned. I discussed with her today that I think sharp debridement to level out the side and helped to remove the rolled edge will be of benefit in order to help this wound heal appropriately. She is in agreement with this  plan and she tolerated the debridement well without complication or pain mostly secondary to her neuropathy with good result. Integumentary (Hair, Skin) Wound #1 status is Open. Original cause of wound was Gradually Appeared. The wound is located on the Right Toe Great. The wound measures 1.3cm length x 0.4cm width x 0.5cm depth; 0.408cm^2 area and 0.204cm^3 volume. There is Fat Layer (Subcutaneous Tissue) Exposed exposed. There is no tunneling or undermining  noted. The wound margin is distinct with the outline attached to the wound base. There is large (67-100%) pink, pale granulation within the wound bed. There is no necrotic tissue within the wound bed. The periwound skin appearance exhibited: Callus. The periwound skin appearance did not exhibit: Crepitus, Excoriation, Induration, Rash, Scarring, Dry/Scaly, Maceration, Atrophie Blanche, Cyanosis, Ecchymosis, Hemosiderin Staining, Mottled, Pallor, Rubor, Erythema. Periwound temperature was noted as No Abnormality. Assessment Active Problems ICD-10 E11.621 - Type 2 diabetes mellitus with foot ulcer L97.512 - Non-pressure chronic ulcer of other part of right foot with fat layer exposed E11.40 - Type 2 diabetes mellitus with diabetic neuropathy, unspecified L03.031 - Cellulitis of right toe Criss RosalesSUTTON, Rachelann ([id number]) Procedures Wound #1 Pre-procedure diagnosis of Wound #1 is a Diabetic Wound/Ulcer of the Lower Extremity located on the Right Toe Great .Severity of Tissue Pre Debridement is: Fat layer exposed. There was a Skin/Subcutaneous Tissue Debridement ([id number]) debridement with total area of 0.52 sq cm performed by STONE III, Jamichael Knotts E., PA-C. with the following instrument(s): Curette to remove Non-Viable tissue/material including Exudate, Fat Layer (and Subcutaneous Tissue) Exposed, Fibrin/Slough, Callus, and Subcutaneous after achieving pain control using Lidocaine 4% Topical Solution. A time out was conducted at 13:45, prior to the start of the procedure. A Moderate amount of bleeding was controlled with Silver Nitrate. The procedure was tolerated well with a pain level of Insensate throughout and a pain level of Insensate following the procedure. Post Debridement Measurements: 1.3cm length x 0.6cm width x 0.5cm depth; 0.306cm^3 volume. Character of Wound/Ulcer Post Debridement is stable. Severity of Tissue Post Debridement is: Fat layer exposed. Post procedure Diagnosis Wound #1:  Same as Pre-Procedure Plan Wound Cleansing: Wound #1 Right Toe Great: Cleanse wound with mild soap and water May Shower, gently pat wound dry prior to applying new dressing. May shower with protection. Anesthetic: Wound #1 Right Toe Great: Topical Lidocaine 4% cream applied to wound bed prior to debridement - in clinic Primary Wound Dressing: Wound #1 Right Toe Great: Prisma Ag Secondary Dressing: Wound #1 Right Toe Great: Gauze and Kerlix/Conform Dressing Change Frequency: Wound #1 Right Toe Great: Change dressing every other day. Follow-up Appointments: Wound #1 Right Toe Great: Return Appointment in 1 week. Off-Loading: Wound #1 Right Toe Great: Open toe surgical shoe with peg assist. Additional Orders / Instructions: Wound #1 [address] GreatJoanne Gavel: Pollard, Gavin PoundEBORAH ([id number]) Increase protein intake. Activity as tolerated General Notes: I am going to initiate a silver alginate dressing for treatment over the next week. Otherwise we will start with the other above wound care orders during this time as well per above. We will see were things stand in one weeks time although I explained to patient today the wound bed look excellent post debridement and with appropriate offloading with the front offload her shoe I think she is going to do very well. She was advised aware this any time she is up and walking around even inside her home. We will see her in one week for reevaluation. Electronic Signature(s) Signed: 11/11/2016 10:10:39 AM By: Lenda KelpStone III, Cainan Trull PA-C Entered By:  Lenda Kelp on 11/11/2016 08:33:11 EMELYN, ROEN ([id number]) -------------------------------------------------------------------------------- ROS/PFSH Details Patient Name: JAHNE, KRUKOWSKI 11/10/2016 12:45 Date of Service: PM Medical Record [id number] Number: Patient Account Number: [id number] 09-Jun-1945 (71 y.o. Treating RN: Afful, RN, BSN, [place] Sink Date of Birth/Sex: Female) Other Clinician: Primary Care  Provider: TOBIN, REBECCA Treating STONE III, Lawren Sexson Referring Provider: Ruffin Pyo Provider/Extender: Weeks in Treatment: 0 Information Obtained From Patient Wound History Do you currently have one or more open woundso Yes How many open wounds do you currently haveo 1 Approximately how long have you had your woundso 4 month How have you been treating your wound(s) until nowo neoporin Has your wound(s) ever healed and then re-openedo No Have you had any lab work done in the past montho No Have you tested positive for an antibiotic resistant organism (MRSA, VRE)o No Have you tested positive for osteomyelitis (bone infection)o No Have you had any tests for circulation on your legso No Integumentary (Skin) Complaints and Symptoms: Positive for: Wounds Medical History: Negative for: History of Burn; History of pressure wounds Neurologic Complaints and Symptoms: Positive for: Numbness/parasthesias Medical History: Positive for: Neuropathy Constitutional Symptoms (General Health) Complaints and Symptoms: No Complaints or Symptoms Eyes Complaints and Symptoms: No Complaints or Symptoms Medical HistoryISA, HITZ ([id number]) Negative for: Cataracts; Glaucoma; Optic Neuritis Ear/Nose/Mouth/Throat Complaints and Symptoms: No Complaints or Symptoms Medical History: Negative for: Chronic sinus problems/congestion; Middle ear problems Hematologic/Lymphatic Complaints and Symptoms: No Complaints or Symptoms Medical History: Positive for: Anemia Negative for: Hemophilia; Human Immunodeficiency Virus; Lymphedema; Sickle Cell Disease Respiratory Complaints and Symptoms: No Complaints or Symptoms Medical History: Negative for: Aspiration; Asthma; Chronic Obstructive Pulmonary Disease (COPD); Pneumothorax; Sleep Apnea; Tuberculosis Cardiovascular Complaints and Symptoms: No Complaints or Symptoms Medical History: Positive for: Hypertension Gastrointestinal Complaints  and Symptoms: No Complaints or Symptoms Medical History: Negative for: Cirrhosis ; Colitis; Crohnos; Hepatitis A; Hepatitis B; Hepatitis C Endocrine Complaints and Symptoms: No Complaints or Symptoms Medical History: Positive for: Type II Diabetes ORETHA, WEISMANN ([id number]) Time with diabetes: 5 years Treated with: Oral agents Blood sugar tested every day: No Genitourinary Complaints and Symptoms: No Complaints or Symptoms Medical History: Negative for: End Stage Renal Disease Immunological Complaints and Symptoms: No Complaints or Symptoms Medical History: Negative for: Lupus Erythematosus; Raynaudos; Scleroderma Musculoskeletal Complaints and Symptoms: No Complaints or Symptoms Medical History: Positive for: Osteoarthritis Oncologic Complaints and Symptoms: No Complaints or Symptoms Medical History: Negative for: Received Chemotherapy; Received Radiation Psychiatric Complaints and Symptoms: No Complaints or Symptoms Medical History: Negative for: Anorexia/bulimia; Confinement Anxiety Immunizations Pneumococcal Vaccine: Received Pneumococcal Vaccination: Yes Family and Social History Cancer: No; Diabetes: Yes - Father; Heart Disease: No; Hereditary Spherocytosis: No; Hypertension: No; Kidney Disease: No; Lung Disease: No; Seizures: No; Stroke: No; Thyroid Problems: No; Tuberculosis: No; NOUR, RODRIGUES ([id number]) Former smoker - quit 58yrs; Marital Status - Single; Alcohol Use: Rarely; Drug Use: No History; Caffeine Use: Moderate; Financial Concerns: No; Food, Clothing or Shelter Needs: No; Support System Lacking: No; Transportation Concerns: No; Advanced Directives: No; Living Will: No Electronic Signature(s) Signed: 11/10/2016 4:42:39 PM By: Elpidio Eric BSN, RN Signed: 11/11/2016 10:10:39 AM By: Lenda Kelp PA-C Entered By: Elpidio Eric on 11/10/2016 13:08:55 [name]  ([id number]) -------------------------------------------------------------------------------- SuperBill Details Patient Name: [name] Date of Service: 11/10/2016 Medical Record Patient Account Number: [id number] Number: Afful, RN, BSN, Treating RN: 04-30-1946 [phone] y.o. [place] Date of Birth/Sex: Female) Other Clinician: Primary Care Provider: TOBIN, REBECCA Treating STONE III, Valincia Touch Referring Provider: Ruffin Pyo Provider/Extender: Weeks in Treatment: 0 Diagnosis  Coding ICD-10 Codes Code Description E11.621 Type 2 diabetes mellitus with foot ulcer L97.512 Non-pressure chronic ulcer of other part of right foot with fat layer exposed E11.40 Type 2 diabetes mellitus with diabetic neuropathy, unspecified L03.031 Cellulitis of right toe Facility Procedures CPT4 Code Description: 40981191 99213 - WOUND CARE VISIT-LEV 3 EST PT Modifier: Quantity: 1 CPT4 Code Description: 47829562 11042 - DEB SUBQ TISSUE 20 SQ CM/< ICD-10 Description Diagnosis L97.512 Non-pressure chronic ulcer of other part of right foo Modifier: t with fat la Quantity: 1 yer exposed Physician Procedures CPT4 Code Description: 1308657 WC PHYS LEVEL 3 o NEW PT ICD-10 Description Diagnosis E11.621 Type 2 diabetes mellitus with foot ulcer L97.512 Non-pressure chronic ulcer of other part of right fo E11.40 Type 2 diabetes mellitus with diabetic neuropathy, u  L03.031 Cellulitis of right toe Modifier: 25 ot with fat lay nspecified Quantity: 1 er exposed CPT4 Code Description: 8469629 11042 - WC PHYS SUBQ TISS 20 SQ CM ICD-10 Description Diagnosis L97.512 Non-pressure chronic ulcer of other part of right fo ALLEXIS, BORDENAVE ([id number]) Modifier: ot with fat lay Quantity: 1 er exposed Electronic Signature(s) Signed: 11/11/2016 10:10:39 AM By: Lenda Kelp PA-C Entered By: Lenda Kelp on 11/11/2016 08:33:39

## 2016-11-17 ENCOUNTER — Encounter: Payer: Medicare Other | Admitting: Internal Medicine

## 2016-11-17 DIAGNOSIS — E11621 Type 2 diabetes mellitus with foot ulcer: Secondary | ICD-10-CM | POA: Diagnosis not present

## 2016-11-18 NOTE — Progress Notes (Signed)
OZELL, FERRERA ([id number]) Visit Report for 11/17/2016 Chief Complaint Document Details Patient Name: Theresa Jensen, Theresa Jensen Date of Service: 11/17/2016 11:15 AM Medical Record Patient Account Number: [id number] Number: Treating RN: Clover Mealy, RN, BSN, Rita [birth year] [phone] y.o. Other Clinician: Date of Birth/Sex: Female) Treating Libby Goehring Primary Care Provider: Ruffin Pyo Provider/Extender: G Referring Provider: Ruffin Pyo Weeks in Treatment: 1 Information Obtained from: Patient Chief Complaint Right Great Toe Ulcer Electronic Signature(s) Signed: 11/17/2016 4:47:30 PM By: Baltazar Najjar MD Entered By: Baltazar Najjar on 11/17/2016 12:54:40 Theresa Jensen ([id number]) -------------------------------------------------------------------------------- Debridement Details Patient Name: Theresa Jensen Date of Service: 11/17/2016 11:15 AM Medical Record Patient Account Number: [id number] Number: Treating RN: Clover Mealy RN, BSN, Rita 01-19-1946 [phone] y.o. Other Clinician: Date of Birth/Sex: Female) Treating Mrytle Bento Primary Care Provider: Ruffin Pyo Provider/Extender: G Referring Provider: Ruffin Pyo Weeks in Treatment: 1 Debridement Performed for Wound #1 Right Toe Great Assessment: Performed By: Physician Maxwell Caul, MD Debridement: Debridement Severity of Tissue Pre Fat layer exposed Debridement: Pre-procedure Verification/Time Out Yes - 12:01 Taken: Start Time: 12:01 Pain Control: Lidocaine 4% Topical Solution Level: Skin/Subcutaneous Tissue Total Area Debrided (L x 1.5 (cm) x 0.5 (cm) = 0.75 (cm) W): Tissue and other Non-Viable, Callus, Fat, Fibrin/Slough, Subcutaneous material debrided: Instrument: Curette Bleeding: Minimum Hemostasis Achieved: Pressure End Time: 12:04 Procedural Pain: Insensate Post Procedural Pain: Insensate Response to Treatment: Procedure was tolerated well Post Debridement Measurements of Total  Wound Length: (cm) 1.5 Width: (cm) 0.5 Depth: (cm) 0.4 Volume: (cm) 0.236 Character of Wound/Ulcer Post Stable Debridement: Severity of Tissue Post Debridement: Fat layer exposed Post Procedure Diagnosis Same as Pre-procedure Electronic Signature(s) Webster, [name] ([id number]) Signed: 11/17/2016 4:47:30 PM By: Baltazar Najjar MD Signed: 11/17/2016 6:10:04 PM By: Elpidio Eric BSN, RN Entered By: Baltazar Najjar on 11/17/2016 12:57:50 Theresa Jensen ([id number]) -------------------------------------------------------------------------------- HPI Details Patient Name: Theresa Jensen Date of Service: 11/17/2016 11:15 AM Medical Record Patient Account Number: [id number] Number: Treating RN: Clover Mealy, RN, BSN, Rita [birth year] [phone] y.o. Other Clinician: Date of Birth/Sex: Female) Treating Vuong Musa Primary Care Provider: Ruffin Pyo Provider/Extender: G Referring Provider: Ruffin Pyo Weeks in Treatment: 1 History of Present Illness HPI Description: 11/10/16 patient presents for evaluation as referral from her primary care provider, Ruffin Pyo, MD who has been treating her right great toe wound. Unfortunately she has had issues with diabetic control and they are working on that. Nonetheless her hemoglobin A1c on last check which was April 2018 with 8.2. She did have an x-ray which was performed and evaluated by her primary on 09/07/16 which is commented in the note to show no bone involvement. I'll see this is good news. Mainly she has been performing wet to dry packing of the wound and on the Sep 07, 2016 encounter it was noted that her primary did pair down some of the callus surrounding the wound. However no excisional debridement of the wound margins especially where it is immediately undermining and rolled is noted. She has been on two rounds of antibiotics clindamycin and Cipro fortunately there does not appear to be any sign of infection at this  point. 11/17/16; patient admitted to the clinic last week with a diabetic foot ulcer on the plantar right great toe Electronic Signature(s) Signed: 11/17/2016 4:47:30 PM By: Baltazar Najjar MD Entered By: Baltazar Najjar on 11/17/2016 12:55:21 Theresa Jensen ([id number]) -------------------------------------------------------------------------------- Physical Exam Details Patient Name: Theresa Jensen Date of Service: 11/17/2016 11:15 AM Medical Record Patient Account Number: [id number] Number: Treating RN: Clover Mealy, RN, BSN, [place] S[place] [birth year] (  71 y.o. Other Clinician: Date of Birth/Sex: Female) Treating Smokey Melott Primary Care Provider: Ruffin Pyo Provider/Extender: G Referring Provider: Ruffin Pyo Weeks in Treatment: 1 Constitutional Sitting or standing Blood Pressure is within target range for patient.. Pulse regular and within target range for patient.Marland Kitchen Respirations regular, non-labored and within target range.. Temperature is normal and within the target range for the patient.Marland Kitchen appears in no distress. Cardiovascular Pedal pulses palpable and strong bilaterally.. Notes Right great toe wound on the plantar aspect. Using a #3 curet brought it up copious amounts of callus nonviable skin and subcutaneous tissue as well as a gritty nonviable surface over the surface of the wound. This cleans up quite nicely. She is using a Optician, dispensing) Signed: 11/17/2016 4:47:30 PM By: Baltazar Najjar MD Entered By: Baltazar Najjar on 11/17/2016 12:59:52 Theresa Jensen ([id number]) -------------------------------------------------------------------------------- Physician Orders Details Patient Name: Theresa Jensen Date of Service: 11/17/2016 11:15 AM Medical Record Patient Account Number: [id number] Number: Treating RN: Clover Mealy RN, BSN, Rita [birth year] [phone] y.o. Other Clinician: Date of Birth/Sex: Female) Treating Ayren Zumbro Primary Care Provider:  Ruffin Pyo Provider/Extender: G Referring Provider: Ruffin Pyo Weeks in Treatment: 1 Verbal / Phone Orders: No Diagnosis Coding Wound Cleansing Wound #1 Right Toe Great o Cleanse wound with mild soap and water o May Shower, gently pat wound dry prior to applying new dressing. o May shower with protection. Anesthetic Wound #1 Right Toe Great o Topical Lidocaine 4% cream applied to wound bed prior to debridement - in clinic Primary Wound Dressing Wound #1 Right Toe Great o Prisma Ag Secondary Dressing Wound #1 Right Toe Great o Gauze and Kerlix/Conform Dressing Change Frequency Wound #1 Right Toe Great o Change dressing every other day. Follow-up Appointments Wound #1 Right Toe Great o Return Appointment in 1 week. Off-Loading Wound #1 Right Toe Great o Open toe surgical shoe with peg assist. Additional Orders / Instructions Wound #1 Right Toe Great o Increase protein intake. Theresa Jensen, Theresa Jensen ([id number]) o Activity as tolerated Electronic Signature(s) Signed: 11/17/2016 4:47:30 PM By: Baltazar Najjar MD Signed: 11/17/2016 6:10:04 PM By: Elpidio Eric BSN, RN Entered By: Elpidio Eric on 11/17/2016 12:03:57 Theresa Jensen ([id number]) -------------------------------------------------------------------------------- Problem List Details Patient Name: Theresa Jensen Date of Service: 11/17/2016 11:15 AM Medical Record Patient Account Number: [id number] Number: Treating RN: Clover Mealy RN, BSN, Rita 10-Feb-1946 [phone] y.o. 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-pressure chronic ulcer of other part of right foot with 11/11/2016 Yes fat layer  exposed E11.40 Type 2 diabetes mellitus with diabetic neuropathy, 11/11/2016 Yes unspecified L03.031 Cellulitis of right toe 11/11/2016 Yes Inactive Problems Resolved Problems Electronic Signature(s) Signed: 11/17/2016 4:47:30 PM By: Baltazar Najjar MD Entered By: Baltazar Najjar on 11/17/2016 12:53:25 Theresa Jensen ([id number]) -------------------------------------------------------------------------------- Progress Note Details Patient Name: Theresa Jensen Date of Service: 11/17/2016 11:15 AM Medical Record Patient Account Number: [id number] Number: Treating RN: Clover Mealy, RN, BSN, Rita 11-Jan-1946 [phone] y.o. Other Clinician: Date of Birth/Sex: Female) Treating Halana Deisher Primary Care Provider: Ruffin Pyo Provider/Extender: G Referring Provider: Ruffin Pyo Weeks in Treatment: 1 Subjective Chief Complaint Information obtained from Patient Right Great Toe Ulcer History of Present Illness (HPI) 11/10/16 patient presents for evaluation as referral from her primary care provider, Ruffin Pyo, MD who has been treating her right great toe wound. Unfortunately  she has had issues with diabetic control and they are working on that. Nonetheless her hemoglobin A1c on last check which was April 2018 with 8.2. She did have an x-ray which was performed and evaluated by her primary on 09/07/16 which is commented in the note to show no bone involvement. I'll see this is good news. Mainly she has been performing wet to dry packing of the wound and on the Sep 07, 2016 encounter it was noted that her primary did pair down some of the callus surrounding the wound. However no excisional debridement of the wound margins especially where it is immediately undermining and rolled is noted. She has been on two rounds of antibiotics clindamycin and Cipro fortunately there does not appear to be any sign of infection at this point. 11/17/16; patient admitted to the clinic last week with a  diabetic foot ulcer on the plantar right great toe Objective Constitutional Sitting or standing Blood Pressure is within target range for patient.. Pulse regular and within target range for patient.Marland Kitchen. Respirations regular, non-labored and within target range.. Temperature is normal and within the target range for the patient.Marland Kitchen. appears in no distress. Vitals Time Taken: 11:41 AM, Temperature: 98.2 F, Pulse: 94 bpm, Respiratory Rate: 16 breaths/min, Blood Pressure: 117/58 mmHg. Cardiovascular Cadieux, Gavin PoundEBORAH ([id number]) Pedal pulses palpable and strong bilaterally.. General Notes: Right great toe wound on the plantar aspect. Using a #3 curet brought it up copious amounts of callus nonviable skin and subcutaneous tissue as well as a gritty nonviable surface over the surface of the wound. This cleans up quite nicely. She is using a [employer] (Hair, Skin) Wound #1 status is Open. Original cause of wound was Gradually Appeared. The wound is located on the Right Toe Great. The wound measures 1.5cm length x 0.5cm width x 0.4cm depth; 0.589cm^2 area and 0.236cm^3 volume. There is Fat Layer (Subcutaneous Tissue) Exposed exposed. There is no tunneling or undermining noted. There is a medium amount of serosanguineous drainage noted. The wound margin is distinct with the outline attached to the wound base. There is medium (34-66%) pink, pale granulation within the wound bed. There is a small (1-33%) amount of necrotic tissue within the wound bed including Adherent Slough. The periwound skin appearance exhibited: Callus. The periwound skin appearance did not exhibit: Crepitus, Excoriation, Induration, Rash, Scarring, Dry/Scaly, Maceration, Atrophie Blanche, Cyanosis, Ecchymosis, Hemosiderin Staining, Mottled, Pallor, Rubor, Erythema. Periwound temperature was noted as No Abnormality. Assessment Active Problems ICD-10 E11.621 - Type 2 diabetes mellitus with foot ulcer L97.512 - Non-pressure  chronic ulcer of other part of right foot with fat layer exposed E11.40 - Type 2 diabetes mellitus with diabetic neuropathy, unspecified L03.031 - Cellulitis of right toe Procedures Wound #1 Pre-procedure diagnosis of Wound #1 is a Diabetic Wound/Ulcer of the Lower Extremity located on the Right Toe Great .Severity of Tissue Pre Debridement is: Fat layer exposed. There was a Skin/Subcutaneous Tissue Debridement ([id number](11042-11047) debridement with total area of 0.75 sq cm performed by Maxwell CaulOBSON, Konrad Hoak G, MD. with the following instrument(s): Curette to remove Non-Viable tissue/material including Fat Layer (and Subcutaneous Tissue) Exposed, Fibrin/Slough, Callus, and Subcutaneous after achieving pain control using Lidocaine 4% Topical Solution. A time out was conducted at 12:01, prior to the start of the procedure. A Minimum amount of bleeding was controlled with Pressure. The procedure was tolerated well with a pain level of Insensate throughout and a pain level of Insensate following the procedure. Post Debridement Measurements: 1.5cm length x 0.5cm width x 0.4cm depth; 0.236cm^3  volume. Theresa Jensen, Theresa Jensen ([id number]) Character of Wound/Ulcer Post Debridement is stable. Severity of Tissue Post Debridement is: Fat layer exposed. Post procedure Diagnosis Wound #1: Same as Pre-Procedure Plan Wound Cleansing: Wound #1 Right Toe Great: Cleanse wound with mild soap and water May Shower, gently pat wound dry prior to applying new dressing. May shower with protection. Anesthetic: Wound #1 Right Toe Great: Topical Lidocaine 4% cream applied to wound bed prior to debridement - in clinic Primary Wound Dressing: Wound #1 Right Toe Great: Prisma Ag Secondary Dressing: Wound #1 Right Toe Great: Gauze and Kerlix/Conform Dressing Change Frequency: Wound #1 Right Toe Great: Change dressing every other day. Follow-up Appointments: Wound #1 Right Toe Great: Return Appointment in 1  week. Off-Loading: Wound #1 Right Toe Great: Open toe surgical shoe with peg assist. Additional Orders / Instructions: Wound #1 Right Toe Great: Increase protein intake. Activity as tolerated #1 after debridement the wound generally looked fairly healthy. We continued with the Prisma, gauze and dark O forefoot offloading shoe. She is a very active woman who lives on her own. I think this may be largely an offloading issue Theresa Jensen, Theresa Jensen ([id number]) Electronic Signature(s) Signed: 11/17/2016 4:47:30 PM By: Baltazar Najjar MD Entered By: Baltazar Najjar on 11/17/2016 13:04:14 Theresa Jensen ([id number]) -------------------------------------------------------------------------------- SuperBill Details Patient Name: Theresa Jensen Date of Service: 11/17/2016 Medical Record Patient Account Number: [id number] Number: Treating RN: Clover Mealy RN, BSN, Rita 1946-02-04 [phone] y.o. Other Clinician: Date of Birth/Sex: Female) Treating Dailey Alberson Primary Care Provider: Ruffin Pyo Provider/Extender: G Referring Provider: Ruffin Pyo Weeks in Treatment: 1 Diagnosis Coding ICD-10 Codes Code Description E11.621 Type 2 diabetes mellitus with foot ulcer L97.512 Non-pressure chronic ulcer of other part of right foot with fat layer exposed E11.40 Type 2 diabetes mellitus with diabetic neuropathy, unspecified L03.031 Cellulitis of right toe Facility Procedures CPT4 Code Description: [id number] 11042 - DEB SUBQ TISSUE 20 SQ CM/< ICD-10 Description Diagnosis E11.621 Type 2 diabetes mellitus with foot ulcer L97.512 Non-pressure chronic ulcer of other part of right fo Modifier: ot with fat la Quantity: 1 yer exposed Physician Procedures CPT4 Code Description: 2952841 11042 - WC PHYS SUBQ TISS 20 SQ CM ICD-10 Description Diagnosis E11.621 Type 2 diabetes mellitus with foot ulcer L97.512 Non-pressure chronic ulcer of other part of right fo Modifier: ot with fat lay Quantity: 1 er  exposed Electronic Signature(s) Signed: 11/17/2016 4:47:30 PM By: Baltazar Najjar MD Entered By: Baltazar Najjar on 11/17/2016 13:04:39

## 2016-11-18 NOTE — Progress Notes (Signed)
ANGLINE, SCHWEIGERT ([id number]) Visit Report for 11/17/2016 Arrival Information Details Patient Name: Theresa Jensen, Theresa Jensen Date of Service: 11/17/2016 11:15 AM Medical Record Patient Account Number: [id number] Number: Treating RN: Clover Mealy, RN, BSN, Rita [birth year] [phone] y.o. Other Clinician: Date of Birth/Sex: Female) Treating ROBSON, MICHAEL Primary Care Nanetta Wiegman: Ruffin Pyo Mylah Baynes/Extender: G Referring Maricela Kawahara: Ruffin Pyo Weeks in Treatment: 1 Visit Information History Since Last Visit All ordered tests and consults were No Patient Arrived: Ambulatory completed: Arrival Time: 11:38 Added or deleted any medications: No Accompanied By: self Any new allergies or adverse reactions: No Transfer Assistance: None Had a fall or experienced change in No Patient Identification Verified: Yes activities of daily living that may affect Secondary Verification Process Yes risk of falls: Completed: Signs or symptoms of abuse/neglect since last No Patient Requires Transmission- No visito Based Precautions: Hospitalized since last visit: No Patient Has Alerts: Yes Has Dressing in Place as Prescribed: Yes Patient Alerts: Patient on Blood Has Footwear/Offloading in Place as Yes Thinner Prescribed: Hgb A1C 8.2 Right: Wedge (may) Shoe Pain Present Now: No Electronic Signature(s) Signed: 11/17/2016 6:10:04 PM By: Elpidio Eric BSN, RN Entered By: Elpidio Eric on 11/17/2016 11:41:09 Theresa Jensen ([id number]) -------------------------------------------------------------------------------- Encounter Discharge Information Details Patient Name: Theresa Jensen Date of Service: 11/17/2016 11:15 AM Medical Record Patient Account Number: [id number] Number: Treating RN: Clover Mealy, RN, BSN, Rita 19-Jun-1945 [phone] y.o. Other Clinician: Date of Birth/Sex: Female) Treating ROBSON, MICHAEL Primary Care Latrelle Fuston: Ruffin Pyo Aimy Sweeting/Extender: G Referring Kianah Harries: Ruffin Pyo Weeks in Treatment: 1 Encounter Discharge Information Items Discharge Pain Level: 0 Discharge Condition: Stable Ambulatory Status: Ambulatory Discharge Destination: Home Transportation: Private Auto Accompanied By: self Schedule Follow-up Appointment: No Medication Reconciliation completed and provided to Patient/Care No Xue Low: Provided on Clinical Summary of Care: 11/17/2016 Form Type Recipient Paper Patient DS Electronic Signature(s) Signed: 11/17/2016 12:09:53 PM By: Gwenlyn Perking Entered By: Gwenlyn Perking on 11/17/2016 12:09:53 Theresa Jensen ([id number]) -------------------------------------------------------------------------------- Lower Extremity Assessment Details Patient Name: Theresa Jensen Date of Service: 11/17/2016 11:15 AM Medical Record Patient Account Number: [id number] Number: Treating RN: Clover Mealy RN, BSN, Rita 21-Jan-1946 [phone] y.o. Other Clinician: Date of Birth/Sex: Female) Treating ROBSON, MICHAEL Primary Care Karlis Cregg: Ruffin Pyo Kirin Brandenburger/Extender: G Referring Field Staniszewski: Ruffin Pyo Weeks in Treatment: 1 Edema Assessment Assessed: [Left: No] [Right: No] E[Left: dema] [Right: :] Calf Left: Right: Point of Measurement: 37 cm From Medial Instep cm cm Ankle Left: Right: Point of Measurement: 9 cm From Medial Instep cm cm Vascular Assessment Claudication: Claudication Assessment [Right:None] Pulses: Dorsalis Pedis Palpable: [Right:Yes] Posterior Tibial Extremity colors, hair growth, and conditions: Extremity Color: [Right:Normal] Hair Growth on Extremity: [Right:Yes] Temperature of Extremity: [Right:Warm] Capillary Refill: [Right:< 3 seconds] Toe Nail Assessment Left: Right: Thick: Yes Discolored: Yes Deformed: No Improper Length and Hygiene: No Electronic Signature(s) Signed: 11/17/2016 6:10:04 PM By: Elpidio Eric BSN, RN [place], [name] ([id number]) Entered By: Elpidio Eric on 11/17/2016 11:41:37 Theresa Jensen  ([id number]) -------------------------------------------------------------------------------- Multi Wound Chart Details Patient Name: Theresa Jensen Date of Service: 11/17/2016 11:15 AM Medical Record Patient Account Number: [id number] Number: Treating RN: Clover Mealy, RN, BSN, Rita May 06, 1945 [phone] y.o. Other Clinician: Date of Birth/Sex: Female) Treating ROBSON, MICHAEL Primary Care Vadim Centola: Ruffin Pyo Chelsea Pedretti/Extender: G Referring Bridgette Wolden: Ruffin Pyo Weeks in Treatment: 1 Vital Signs Height(in): Pulse(bpm): 94 Weight(lbs): Blood Pressure 117/58 (mmHg): Body Mass Index(BMI): Temperature(F): 98.2 Respiratory Rate 16 (breaths/min): Photos: [1:No Photos] [N/A:N/A] Wound Location: [1:Right Toe Great] [N/A:N/A] Wounding Event: [1:Gradually Appeared] [N/A:N/A] Primary Etiology: [1:Diabetic Wound/Ulcer of N/A the Lower Extremity] Comorbid History: [1:Anemia,  Hypertension, Type II Diabetes, Osteoarthritis, Neuropathy] [N/A:N/A] Date Acquired: [1:07/06/2016] [N/A:N/A] Weeks of Treatment: [1:1] [N/A:N/A] Wound Status: [1:Open] [N/A:N/A] Measurements L x W x D 1.5x0.5x0.4 [N/A:N/A] (cm) Area (cm) : [1:0.589] [N/A:N/A] Volume (cm) : [1:0.236] [N/A:N/A] % Reduction in Area: [1:-44.40%] [N/A:N/A] % Reduction in Volume: -15.70% [N/A:N/A] Classification: [1:Grade 1] [N/A:N/A] Exudate Amount: [1:Medium] [N/A:N/A] Exudate Type: [1:Serosanguineous] [N/A:N/A] Exudate Color: [1:red, brown] [N/A:N/A] Wound Margin: [1:Distinct, outline attached N/A] Granulation Amount: [1:Medium (34-66%)] [N/A:N/A] Granulation Quality: [1:Pink, Pale] [N/A:N/A] Necrotic Amount: [1:Small (1-33%)] [N/A:N/A] Exposed Structures: [1:Fat Layer (Subcutaneous N/A Tissue) Exposed: Yes Fascia: No] Tendon: No Muscle: No Joint: No Bone: No Epithelialization: None N/A N/A Debridement: Debridement (16109- N/A N/A 11047) Pre-procedure 12:01 N/A N/A Verification/Time Out Taken: Pain Control:  Lidocaine 4% Topical N/A N/A Solution Tissue Debrided: Fibrin/Slough, Fat, Callus, N/A N/A Subcutaneous Level: Skin/Subcutaneous N/A N/A Tissue Debridement Area (sq 0.75 N/A N/A cm): Instrument: Curette N/A N/A Bleeding: Minimum N/A N/A Hemostasis Achieved: Pressure N/A N/A Procedural Pain: Insensate N/A N/A Post Procedural Pain: Insensate N/A N/A Debridement Treatment Procedure was tolerated N/A N/A Response: well Post Debridement 1.5x0.5x0.4 N/A N/A Measurements L x W x D (cm) Post Debridement 0.236 N/A N/A Volume: (cm) Periwound Skin Texture: Callus: Yes N/A N/A Excoriation: No Induration: No Crepitus: No Rash: No Scarring: No Periwound Skin Maceration: No N/A N/A Moisture: Dry/Scaly: No Periwound Skin Color: Atrophie Blanche: No N/A N/A Cyanosis: No Ecchymosis: No Erythema: No Hemosiderin Staining: No Mottled: No Pallor: No Rubor: No Temperature: No Abnormality N/A N/A Tenderness on No N/A N/A Palpation: Wound Preparation: N/A N/A MINEOLA, DUAN ([id number]) Ulcer Cleansing: Rinsed/Irrigated with Saline Topical Anesthetic Applied: Other: lidocaine 4% Procedures Performed: Debridement N/A N/A Treatment Notes Wound #1 (Right Toe Great) 1. Cleansed with: Cleanse wound with antibacterial soap and water 4. Dressing Applied: Prisma Ag 5. Secondary Dressing Applied Gauze and Kerlix/Conform 7. Secured with Tape Notes front offload Electronic Signature(s) Signed: 11/17/2016 4:47:30 PM By: Baltazar Najjar MD Entered By: Baltazar Najjar on 11/17/2016 12:54:02 Theresa Jensen ([id number]) -------------------------------------------------------------------------------- Multi-Disciplinary Care Plan Details Patient Name: Theresa Jensen Date of Service: 11/17/2016 11:15 AM Medical Record Patient Account Number: [id number] Number: Treating RN: Clover Mealy RN, BSN, Rita 1945-10-22 [phone] y.o. Other Clinician: Date of Birth/Sex: Female) Treating ROBSON,  MICHAEL Primary Care Sayeed Weatherall: Ruffin Pyo Jagdeep Ancheta/Extender: G Referring Advit Trethewey: Ruffin Pyo Weeks in Treatment: 1 Active Inactive ` Orientation to the Wound Care Program Nursing Diagnoses: Knowledge deficit related to the wound healing center program Goals: Patient/caregiver will verbalize understanding of the Wound Healing Center Program Date Initiated: 11/10/2016 Target Resolution Date: 03/13/2017 Goal Status: Active Interventions: Provide education on orientation to the wound center Notes: ` Peripheral Neuropathy Nursing Diagnoses: Knowledge deficit related to disease process and management of peripheral neurovascular dysfunction Potential alteration in peripheral tissue perfusion (select prior to confirmation of diagnosis) Goals: Patient/caregiver will verbalize understanding of disease process and disease management Date Initiated: 11/10/2016 Target Resolution Date: 03/13/2017 Goal Status: Active Interventions: Assess signs and symptoms of neuropathy upon admission and as needed Provide education on Management of Neuropathy and Related Ulcers Provide education on Management of Neuropathy upon discharge from the Wound Center Notes: BERIT, RACZKOWSKI ([id number]) Pressure Nursing Diagnoses: Knowledge deficit related to management of pressures ulcers Potential for impaired tissue integrity related to pressure, friction, moisture, and shear Goals: Patient will remain free from development of additional pressure ulcers Date Initiated: 11/10/2016 Target Resolution Date: 03/13/2017 Goal Status: Active Patient will remain free of pressure ulcers Date Initiated: 11/10/2016 Target Resolution Date: 03/13/2017 Goal  Status: Active Patient/caregiver will verbalize risk factors for pressure ulcer development Date Initiated: 11/10/2016 Target Resolution Date: 03/13/2017 Goal Status: Active Patient/caregiver will verbalize understanding of pressure ulcer management Date  Initiated: 11/10/2016 Target Resolution Date: 03/13/2017 Goal Status: Active Interventions: Assess: immobility, friction, shearing, incontinence upon admission and as needed Assess offloading mechanisms upon admission and as needed Assess potential for pressure ulcer upon admission and as needed Provide education on pressure ulcers Treatment Activities: Patient referred for home evaluation of offloading devices/mattresses : 11/10/2016 Patient referred for pressure reduction/relief devices : 11/10/2016 Notes: ` Wound/Skin Impairment Nursing Diagnoses: Impaired tissue integrity Knowledge deficit related to ulceration/compromised skin integrity Goals: Patient/caregiver will verbalize understanding of skin care regimen Date Initiated: 11/10/2016 Target Resolution Date: 03/13/2017 Goal Status: Active Ulcer/skin breakdown will have a volume reduction of 30% by week 4 Date Initiated: 11/10/2016 Target Resolution Date: 03/13/2017 Goal Status: Active CHERITA, HEBEL ([id number]) Ulcer/skin breakdown will have a volume reduction of 50% by week 8 Date Initiated: 11/10/2016 Target Resolution Date: 03/13/2017 Goal Status: Active Ulcer/skin breakdown will have a volume reduction of 80% by week 12 Date Initiated: 11/10/2016 Target Resolution Date: 03/13/2017 Goal Status: Active Ulcer/skin breakdown will heal within 14 weeks Date Initiated: 11/10/2016 Target Resolution Date: 03/13/2017 Goal Status: Active Interventions: Assess patient/caregiver ability to obtain necessary supplies Assess patient/caregiver ability to perform ulcer/skin care regimen upon admission and as needed Assess ulceration(s) every visit Provide education on ulcer and skin care Treatment Activities: Referred to DME Leon Montoya for dressing supplies : 11/10/2016 Skin care regimen initiated : 11/10/2016 Topical wound management initiated : 11/10/2016 Notes: Electronic Signature(s) Signed: 11/17/2016 6:10:04 PM By: Elpidio Eric  BSN, RN Entered By: Elpidio Eric on 11/17/2016 12:01:24 Theresa Jensen ([id number]) -------------------------------------------------------------------------------- Pain Assessment Details Patient Name: Theresa Jensen Date of Service: 11/17/2016 11:15 AM Medical Record Patient Account Number: [id number] Number: Treating RN: Clover Mealy, RN, BSN, Rita Sep 15, 1945 [phone] y.o. Other Clinician: Date of Birth/Sex: Female) Treating ROBSON, MICHAEL Primary Care Ameet Sandy: Ruffin Pyo Tujuana Kilmartin/Extender: G Referring Martavius Lusty: Ruffin Pyo Weeks in Treatment: 1 Active Problems Location of Pain Severity and Description of Pain Patient Has Paino No Site Locations With Dressing Change: No Pain Management and Medication Current Pain Management: Electronic Signature(s) Signed: 11/17/2016 6:10:04 PM By: Elpidio Eric BSN, RN Entered By: Elpidio Eric on 11/17/2016 11:41:15 Theresa Jensen ([id number]) -------------------------------------------------------------------------------- Patient/Caregiver Education Details Patient Name: Theresa Jensen Date of Service: 11/17/2016 11:15 AM Medical Record Patient Account Number: [id number] Number: Treating RN: Clover Mealy, RN, BSN, Rita May 31, 1945 [phone] y.o. Other Clinician: Date of Birth/Gender: Female) Treating ROBSON, MICHAEL Primary Care Physician/Extender: Milana Kidney, REBECCA Physician: Tania Ade in Treatment: 1 Referring Physician: Ruffin Pyo Education Assessment Education Provided To: Patient Education Topics Provided Peripheral Neuropathy: Methods: Explain/Verbal Responses: Reinforcements needed Pressure: Methods: Explain/Verbal Responses: Reinforcements needed Smoking and Wound Healing: Methods: Explain/Verbal Responses: Reinforcements needed Welcome To The Wound Care Center: Methods: Explain/Verbal Responses: Reinforcements needed Wound/Skin Impairment: Methods: Explain/Verbal Responses: Reinforcements needed Electronic  Signature(s) Signed: 11/17/2016 6:10:04 PM By: Elpidio Eric BSN, RN Entered By: Elpidio Eric on 11/17/2016 12:08:20 Theresa Jensen ([id number]) -------------------------------------------------------------------------------- Wound Assessment Details Patient Name: Theresa Jensen Date of Service: 11/17/2016 11:15 AM Medical Record Patient Account Number: [id number] Number: Treating RN: Clover Mealy, RN, BSN, Rita 07-06-1945 [phone] y.o. Other Clinician: Date of Birth/Sex: Female) Treating ROBSON, MICHAEL Primary Care Amr Sturtevant: Ruffin Pyo Kashmere Staffa/Extender: G Referring Serjio Deupree: Ruffin Pyo Weeks in Treatment: 1 Wound Status Wound Number: 1 Primary Diabetic Wound/Ulcer of the Lower Etiology: Extremity Wound Location: Right Toe Great Wound Open Wounding Event: Gradually  Appeared Status: Date Acquired: 07/06/2016 Comorbid Anemia, Hypertension, Type II Weeks Of Treatment: 1 History: Diabetes, Osteoarthritis, Neuropathy Clustered Wound: No Photos Photo Uploaded By: Elpidio EricAfful, Rita on 11/17/2016 18:25:00 Wound Measurements Length: (cm) 1.5 Width: (cm) 0.5 Depth: (cm) 0.4 Area: (cm) 0.589 Volume: (cm) 0.236 % Reduction in Area: -44.4% % Reduction in Volume: -15.7% Epithelialization: None Tunneling: No Undermining: No Wound Description Classification: Grade 1 Wound Margin: Distinct, outline attached Exudate Amount: Medium Birenbaum, Gavin PoundEBORAH ([id number]) Foul Odor After Cleansing: No Slough/Fibrino No Exudate Type: Serosanguineous Exudate Color: red, brown Wound Bed Granulation Amount: Medium (34-66%) Exposed Structure Granulation Quality: Pink, Pale Fascia Exposed: No Necrotic Amount: Small (1-33%) Fat Layer (Subcutaneous Tissue) Exposed: Yes Necrotic Quality: Adherent Slough Tendon Exposed: No Muscle Exposed: No Joint Exposed: No Bone Exposed: No Periwound Skin Texture Texture Color No Abnormalities Noted: No No Abnormalities Noted: No Callus: Yes Atrophie  Blanche: No Crepitus: No Cyanosis: No Excoriation: No Ecchymosis: No Induration: No Erythema: No Rash: No Hemosiderin Staining: No Scarring: No Mottled: No Pallor: No Moisture Rubor: No No Abnormalities Noted: No Dry / Scaly: No Temperature / Pain Maceration: No Temperature: No Abnormality Wound Preparation Ulcer Cleansing: Rinsed/Irrigated with Saline Topical Anesthetic Applied: Other: lidocaine 4%, Treatment Notes Wound #1 (Right Toe Great) 1. Cleansed with: Cleanse wound with antibacterial soap and water 4. Dressing Applied: Prisma Ag 5. Secondary Dressing Applied Gauze and Kerlix/Conform 7. Secured with Tape Notes front offload Electronic Signature(s) Signed: 11/17/2016 6:10:04 PM By: Elpidio EricAfful, Rita BSN, RN Entered By: Elpidio EricAfful, Rita on 11/17/2016 11:45:24 Theresa RosalesSUTTON, Jaqlyn ([id number]) Theresa RosalesSUTTON, Maleeyah ([id number]) -------------------------------------------------------------------------------- Vitals Details Patient Name: Theresa RosalesSUTTON, Adaline Date of Service: 11/17/2016 11:15 AM Medical Record Patient Account Number: [id number] Number: Treating RN: Clover MealyAfful, RN, BSN, Rita 1946-02-08 [phone](70 y.o. Other Clinician: Date of Birth/Sex: Female) Treating ROBSON, MICHAEL Primary Care Amalia Edgecombe: Ruffin PyoBIN, REBECCA Kacee Sukhu/Extender: G Referring Nichola Cieslinski: Ruffin PyoBIN, REBECCA Weeks in Treatment: 1 Vital Signs Time Taken: 11:41 Temperature (F): 98.2 Pulse (bpm): 94 Respiratory Rate (breaths/min): 16 Blood Pressure (mmHg): 117/58 Reference Range: 80 - 120 mg / dl Electronic Signature(s) Signed: 11/17/2016 6:10:04 PM By: Elpidio EricAfful, Rita BSN, RN Entered By: Elpidio EricAfful, Rita on 11/17/2016 11:42:00

## 2016-11-24 ENCOUNTER — Encounter: Payer: Medicare Other | Admitting: Internal Medicine

## 2016-11-24 DIAGNOSIS — E11621 Type 2 diabetes mellitus with foot ulcer: Secondary | ICD-10-CM | POA: Diagnosis not present

## 2016-11-25 NOTE — Progress Notes (Addendum)
TAQUISHA, PHUNG ([id number]) Visit Report for 11/24/2016 Arrival Information Details Patient Name: ULAH, OLMO Date of Service: 11/24/2016 2:00 PM Medical Record Patient Account Number: [id number] Number: Treating RN: Clover Mealy, RN, BSN, Rita 1945-07-06 [phone] y.o. Other Clinician: Date of Birth/Sex: Female) Treating ROBSON, MICHAEL Primary Care Nicolis Boody: Ruffin Pyo Aiyah Scarpelli/Extender: G Referring Jovann Luse: Ruffin Pyo Weeks in Treatment: 2 Visit Information History Since Last Visit All ordered tests and consults were completed: No Patient Arrived: Ambulatory Added or deleted any medications: No Arrival Time: 13:50 Any new allergies or adverse reactions: No Accompanied By: self Had a fall or experienced change in No Transfer Assistance: None activities of daily living that may affect Patient Identification Verified: Yes risk of falls: Secondary Verification Process Yes Signs or symptoms of abuse/neglect since last No Completed: visito Patient Requires Transmission- No Hospitalized since last visit: No Based Precautions: Has Dressing in Place as Prescribed: Yes Patient Has Alerts: Yes Pain Present Now: No Patient Alerts: Patient on Blood Thinner Hgb A1C 8.2 (may) Electronic Signature(s) Signed: 11/24/2016 1:50:49 PM By: Elpidio Eric BSN, RN Entered By: Elpidio Eric on 11/24/2016 13:50:49 [name] ([id number]) -------------------------------------------------------------------------------- Encounter Discharge Information Details Patient Name: [name] Date of Service: 11/24/2016 2:00 PM Medical Record Patient Account Number: [id number] Number: Treating RN: Clover Mealy, RN, BSN, Rita [birth year] [phone] y.o. Other Clinician: Date of Birth/Sex: Female) Treating ROBSON, MICHAEL Primary Care Azeem Poorman: Ruffin Pyo Kelsa Jaworowski/Extender: G Referring Jahaad Penado: Ruffin Pyo Weeks in Treatment: 2 Encounter Discharge Information Items Discharge Pain  Level: 0 Discharge Condition: Stable Ambulatory Status: Ambulatory Discharge Destination: Home Transportation: Private Auto Accompanied By: self Schedule Follow-up Appointment: No Medication Reconciliation completed No and provided to Patient/Care Samer Dutton: Patient Clinical Summary of Care: Declined Electronic Signature(s) Signed: 11/24/2016 2:37:34 PM By: Gwenlyn Perking Entered By: Gwenlyn Perking on 11/24/2016 14:37:33 [name] ([id number]) -------------------------------------------------------------------------------- Lower Extremity Assessment Details Patient Name: [name] Date of Service: 11/24/2016 2:00 PM Medical Record Patient Account Number: [id number] Number: Treating RN: Clover Mealy, RN, BSN, Rita 22-Sep-1945 [phone] y.o. Other Clinician: Date of Birth/Sex: Female) Treating ROBSON, MICHAEL Primary Care Nadina Fomby: Ruffin Pyo Orit Sanville/Extender: G Referring Cylas Falzone: Ruffin Pyo Weeks in Treatment: 2 Edema Assessment Assessed: [Left: No] [Right: No] E[Left: dema] [Right: :] Calf Left: Right: Point of Measurement: 37 cm From Medial Instep cm cm Ankle Left: Right: Point of Measurement: 9 cm From Medial Instep cm cm Vascular Assessment Claudication: Claudication Assessment [Left:None] Pulses: Dorsalis Pedis Palpable: [Left:Yes] Posterior Tibial Extremity colors, hair growth, and conditions: Extremity Color: [Left:Normal] Hair Growth on Extremity: [Left:Yes] Temperature of Extremity: [Left:Warm] Capillary Refill: [Left:< 3 seconds] Toe Nail Assessment Left: Right: Thick: Yes Discolored: Yes Deformed: No Improper Length and Hygiene: No Electronic Signature(s) Signed: 11/24/2016 2:24:03 PM By: Elpidio Eric BSN, RN [place], [name] ([id number]) Entered By: Elpidio Eric on 11/24/2016 14:02:52 [name] ([id number]) -------------------------------------------------------------------------------- Multi Wound Chart Details Patient Name:  [name] Date of Service: 11/24/2016 2:00 PM Medical Record Patient Account Number: [id number] Number: Treating RN: Clover Mealy, RN, BSN, Rita 02/05/1946 [phone] y.o. Other Clinician: Date of Birth/Sex: Female) Treating ROBSON, MICHAEL Primary Care Diya Gervasi: Ruffin Pyo [place]/Extender: G Referring Saida Lonon: Ruffin Pyo Weeks in Treatment: 2 Vital Signs Height(in): Pulse(bpm): 79 Weight(lbs): Blood Pressure 118/53 (mmHg): Body Mass Index(BMI): Temperature(F): 97.8 Respiratory Rate 16 (breaths/min): Photos: [1:No Photos] [N/A:N/A] Wound Location: [1:Right Toe Great] [N/A:N/A] Wounding Event: [1:Gradually Appeared] [N/A:N/A] Primary Etiology: [1:Diabetic Wound/Ulcer of N/A the Lower Extremity] Comorbid History: [1:Anemia, Hypertension, Type II Diabetes, Osteoarthritis, Neuropathy] [N/A:N/A] Date Acquired: [1:07/06/2016] [N/A:N/A] Weeks of Treatment: [1:2] [N/A:N/A] Wound  Status: [1:Open] [N/A:N/A] Measurements L x W x D 1.1x0.4x0.3 [N/A:N/A] (cm) Area (cm) : [1:0.346] [N/A:N/A] Volume (cm) : [1:0.104] [N/A:N/A] % Reduction in Area: [1:15.20%] [N/A:N/A] % Reduction in Volume: 49.00% [N/A:N/A] Classification: [1:Grade 1] [N/A:N/A] Exudate Amount: [1:Medium] [N/A:N/A] Exudate Type: [1:Serosanguineous] [N/A:N/A] Exudate Color: [1:red, brown] [N/A:N/A] Wound Margin: [1:Distinct, outline attached N/A] Granulation Amount: [1:Medium (34-66%)] [N/A:N/A] Granulation Quality: [1:Pink, Pale] [N/A:N/A] Necrotic Amount: [1:Small (1-33%)] [N/A:N/A] Exposed Structures: [1:Fat Layer (Subcutaneous N/A Tissue) Exposed: Yes Fascia: No] Tendon: No Muscle: No Joint: No Bone: No Epithelialization: Small (1-33%) N/A N/A Debridement: Debridement ([id number](11042- N/A N/A 11047) Pre-procedure 14:48 N/A N/A Verification/Time Out Taken: Pain Control: Lidocaine 4% Topical N/A N/A Solution Tissue Debrided: Fibrin/Slough, Fat, N/A N/A Subcutaneous Level: Skin/Subcutaneous N/A  N/A Tissue Debridement Area (sq 0.44 N/A N/A cm): Instrument: Curette N/A N/A Bleeding: Minimum N/A N/A Hemostasis Achieved: Pressure N/A N/A Procedural Pain: 0 N/A N/A Post Procedural Pain: 0 N/A N/A Debridement Treatment Procedure was tolerated N/A N/A Response: well Post Debridement 1.1x0.4x0.3 N/A N/A Measurements L x W x D (cm) Post Debridement 0.104 N/A N/A Volume: (cm) Periwound Skin Texture: Callus: Yes N/A N/A Excoriation: No Induration: No Crepitus: No Rash: No Scarring: No Periwound Skin Maceration: No N/A N/A Moisture: Dry/Scaly: No Periwound Skin Color: Atrophie Blanche: No N/A N/A Cyanosis: No Ecchymosis: No Erythema: No Hemosiderin Staining: No Mottled: No Pallor: No Rubor: No Temperature: No Abnormality N/A N/A Tenderness on No N/A N/A Palpation: Wound Preparation: N/A N/A Criss RosalesSUTTON, Calee ([id number]) Ulcer Cleansing: Rinsed/Irrigated with Saline Topical Anesthetic Applied: Other: lidocaine 4% Procedures Performed: Debridement N/A N/A Treatment Notes Wound #1 (Right Toe Great) 4. Dressing Applied: Prisma Ag 5. Secondary Dressing Applied Gauze and Kerlix/Conform 6. Footwear/Offloading device applied Felt/Foam Surgical shoe Notes front offload Electronic Signature(s) Signed: 11/24/2016 6:25:53 PM By: Baltazar Najjarobson, Michael MD Entered By: Baltazar Najjarobson, Michael on 11/24/2016 14:53:17 Criss RosalesSUTTON, Joycie ([id number]) -------------------------------------------------------------------------------- Multi-Disciplinary Care Plan Details Patient Name: Criss RosalesSUTTON, Janna Date of Service: 11/24/2016 2:00 PM Medical Record Patient Account Number: [id number] Number: Treating RN: Clover MealyAfful, RN, BSN, Rita 1945/11/23 [phone](70 y.o. Other Clinician: Date of Birth/Sex: Female) Treating ROBSON, MICHAEL Primary Care Lakevia Perris: Ruffin PyoBIN, REBECCA Shekinah Pitones/Extender: G Referring Kaiea Esselman: Ruffin PyoBIN, REBECCA Weeks in Treatment: 2 Active Inactive ` Orientation to the Wound Care  Program Nursing Diagnoses: Knowledge deficit related to the wound healing center program Goals: Patient/caregiver will verbalize understanding of the Wound Healing Center Program Date Initiated: 11/10/2016 Target Resolution Date: 03/13/2017 Goal Status: Active Interventions: Provide education on orientation to the wound center Notes: ` Peripheral Neuropathy Nursing Diagnoses: Knowledge deficit related to disease process and management of peripheral neurovascular dysfunction Potential alteration in peripheral tissue perfusion (select prior to confirmation of diagnosis) Goals: Patient/caregiver will verbalize understanding of disease process and disease management Date Initiated: 11/10/2016 Target Resolution Date: 03/13/2017 Goal Status: Active Interventions: Assess signs and symptoms of neuropathy upon admission and as needed Provide education on Management of Neuropathy and Related Ulcers Provide education on Management of Neuropathy upon discharge from the Wound Center Notes: [name]` Hoopingarner, Archana ([id number]) Pressure Nursing Diagnoses: Knowledge deficit related to management of pressures ulcers Potential for impaired tissue integrity related to pressure, friction, moisture, and shear Goals: Patient will remain free from development of additional pressure ulcers Date Initiated: 11/10/2016 Target Resolution Date: 03/13/2017 Goal Status: Active Patient will remain free of pressure ulcers Date Initiated: 11/10/2016 Target Resolution Date: 03/13/2017 Goal Status: Active Patient/caregiver will verbalize risk factors for pressure ulcer development Date Initiated: 11/10/2016 Target Resolution Date: 03/13/2017 Goal Status: Active Patient/caregiver will verbalize  understanding of pressure ulcer management Date Initiated: 11/10/2016 Target Resolution Date: 03/13/2017 Goal Status: Active Interventions: Assess: immobility, friction, shearing, incontinence upon admission and as  needed Assess offloading mechanisms upon admission and as needed Assess potential for pressure ulcer upon admission and as needed Provide education on pressure ulcers Treatment Activities: Patient referred for home evaluation of offloading devices/mattresses : 11/10/2016 Patient referred for pressure reduction/relief devices : 11/10/2016 Notes: ` Wound/Skin Impairment Nursing Diagnoses: Impaired tissue integrity Knowledge deficit related to ulceration/compromised skin integrity Goals: Patient/caregiver will verbalize understanding of skin care regimen Date Initiated: 11/10/2016 Target Resolution Date: 03/13/2017 Goal Status: Active Ulcer/skin breakdown will have a volume reduction of 30% by week 4 Date Initiated: 11/10/2016 Target Resolution Date: 03/13/2017 Goal Status: Active Criss RosalesSUTTON, Miya ([id number]) Ulcer/skin breakdown will have a volume reduction of 50% by week 8 Date Initiated: 11/10/2016 Target Resolution Date: 03/13/2017 Goal Status: Active Ulcer/skin breakdown will have a volume reduction of 80% by week 12 Date Initiated: 11/10/2016 Target Resolution Date: 03/13/2017 Goal Status: Active Ulcer/skin breakdown will heal within 14 weeks Date Initiated: 11/10/2016 Target Resolution Date: 03/13/2017 Goal Status: Active Interventions: Assess patient/caregiver ability to obtain necessary supplies Assess patient/caregiver ability to perform ulcer/skin care regimen upon admission and as needed Assess ulceration(s) every visit Provide education on ulcer and skin care Treatment Activities: Referred to DME Karan Inclan for dressing supplies : 11/10/2016 Skin care regimen initiated : 11/10/2016 Topical wound management initiated : 11/10/2016 Notes: Electronic Signature(s) Signed: 11/24/2016 4:37:25 PM By: Elpidio EricAfful, Rita BSN, RN Entered By: Elpidio EricAfful, Rita on 11/24/2016 14:26:13 Criss RosalesSUTTON, Gae ([id number]) -------------------------------------------------------------------------------- Pain  Assessment Details Patient Name: Criss RosalesSUTTON, Terrica Date of Service: 11/24/2016 2:00 PM Medical Record Patient Account Number: [id number] Number: Treating RN: Clover MealyAfful, RN, BSN, Rita [birth year] [phone](70 y.o. Other Clinician: Date of Birth/Sex: Female) Treating ROBSON, MICHAEL Primary Care Peggy Monk: Ruffin PyoBIN, REBECCA Colleena Kurtenbach/Extender: G Referring Ronell Boldin: Ruffin PyoBIN, REBECCA Weeks in Treatment: 2 Active Problems Location of Pain Severity and Description of Pain Patient Has Paino No Site Locations With Dressing Change: No Pain Management and Medication Current Pain Management: Electronic Signature(s) Signed: 11/24/2016 1:50:57 PM By: Elpidio EricAfful, Rita BSN, RN Entered By: Elpidio EricAfful, Rita on 11/24/2016 13:50:56 Criss RosalesSUTTON, Evellyn ([id number]) -------------------------------------------------------------------------------- Patient/Caregiver Education Details Patient Name: Criss RosalesSUTTON, Aanyah Date of Service: 11/24/2016 2:00 PM Medical Record Patient Account Number: [id number] Number: Treating RN: Clover MealyAfful, RN, BSN, Rita [birth year] [phone](70 y.o. Other Clinician: Date of Birth/Gender: Female) Treating ROBSON, MICHAEL Primary Care Physician/Extender: Milana KidneyG TOBIN, REBECCA Physician: Tania AdeWeeks in Treatment: 2 Referring Physician: Ruffin PyoBIN, REBECCA Education Assessment Education Provided To: Patient Education Topics Provided Peripheral Neuropathy: Methods: Explain/Verbal Responses: Reinforcements needed Pressure: Methods: Explain/Verbal Responses: Reinforcements needed Welcome To The Wound Care Center: Methods: Explain/Verbal Responses: State content correctly Wound/Skin Impairment: Methods: Explain/Verbal Responses: State content correctly Electronic Signature(s) Signed: 11/24/2016 4:37:25 PM By: Elpidio EricAfful, Rita BSN, RN Entered By: Elpidio EricAfful, Rita on 11/24/2016 14:36:58 Criss RosalesSUTTON, Steve ([id number]) -------------------------------------------------------------------------------- Wound Assessment Details Patient  Name: Criss RosalesSUTTON, Noya Date of Service: 11/24/2016 2:00 PM Medical Record Patient Account Number: [id number] Number: Treating RN: Clover MealyAfful, RN, BSN, Rita [birth year] [phone](70 y.o. Other Clinician: Date of Birth/Sex: Female) Treating ROBSON, MICHAEL Primary Care Dovber Ernest: Ruffin PyoBIN, REBECCA Maryclaire Stoecker/Extender: G Referring Saiya Crist: Ruffin PyoBIN, REBECCA Weeks in Treatment: 2 Wound Status Wound Number: 1 Primary Diabetic Wound/Ulcer of the Lower Etiology: Extremity Wound Location: Right Toe Great Wound Open Wounding Event: Gradually Appeared Status: Date Acquired: 07/06/2016 Comorbid Anemia, Hypertension, Type II Weeks Of Treatment: 2 History: Diabetes, Osteoarthritis, Neuropathy Clustered Wound: No Photos Photo Uploaded By: Elpidio EricAfful, Rita on 11/24/2016 16:32:29 Wound  Measurements Length: (cm) 1.1 Width: (cm) 0.4 Depth: (cm) 0.3 Area: (cm) 0.346 Volume: (cm) 0.104 % Reduction in Area: 15.2% % Reduction in Volume: 49% Epithelialization: Small (1-33%) Tunneling: No Undermining: No Wound Description Classification: Grade 1 Foul Odor Aft Wound Margin: Distinct, outline attached Slough/Fibrin Exudate Amount: Medium Exudate Type: Serosanguineous Exudate Color: red, brown er Cleansing: No o No Wound Bed Granulation Amount: Medium (34-66%) Exposed Structure Granulation Quality: Pink, Pale Fascia Exposed: No Necrotic Amount: Small (1-33%) Fat Layer (Subcutaneous Tissue) Exposed: Yes Mchale, [name] ([id number]) Necrotic Quality: Adherent Slough Tendon Exposed: No Muscle Exposed: No Joint Exposed: No Bone Exposed: No Periwound Skin Texture Texture Color No Abnormalities Noted: No No Abnormalities Noted: No Callus: Yes Atrophie Blanche: No Crepitus: No Cyanosis: No Excoriation: No Ecchymosis: No Induration: No Erythema: No Rash: No Hemosiderin Staining: No Scarring: No Mottled: No Pallor: No Moisture Rubor: No No Abnormalities Noted: No Dry / Scaly: No Temperature /  Pain Maceration: No Temperature: No Abnormality Wound Preparation Ulcer Cleansing: Rinsed/Irrigated with Saline Topical Anesthetic Applied: Other: lidocaine 4%, Treatment Notes Wound #1 (Right Toe Great) 4. Dressing Applied: Prisma Ag 5. Secondary Dressing Applied Gauze and Kerlix/Conform 6. Footwear/Offloading device applied Felt/Foam Surgical shoe Notes front offload Electronic Signature(s) Signed: 11/24/2016 2:24:03 PM By: Elpidio Eric BSN, RN Entered By: Elpidio Eric on 11/24/2016 14:01:13 [name] ([id number]) -------------------------------------------------------------------------------- Vitals Details Patient Name: [name] Date of Service: 11/24/2016 2:00 PM Medical Record Patient Account Number: [id number] Number: Treating RN: Clover Mealy RN, BSN, Rita 08-22-1945 [phone] y.o. Other Clinician: Date of Birth/Sex: Female) Treating ROBSON, MICHAEL Primary Care Marzell Isakson: Ruffin Pyo Laurena Valko/Extender: G Referring Brand Siever: Ruffin Pyo Weeks in Treatment: 2 Vital Signs Time Taken: 13:57 Temperature (F): 97.8 Pulse (bpm): 79 Respiratory Rate (breaths/min): 16 Blood Pressure (mmHg): 118/53 Reference Range: 80 - 120 mg / dl Electronic Signature(s) Signed: 11/24/2016 2:24:03 PM By: Elpidio Eric BSN, RN Entered By: Elpidio Eric on 11/24/2016 13:57:53

## 2016-11-26 NOTE — Progress Notes (Signed)
Theresa Jensen, Theresa Jensen ([id number]) Visit Report for 11/24/2016 Debridement Details Patient Name: Theresa Jensen, Theresa Jensen Date of Service: 11/24/2016 2:00 PM Medical Record Patient Account Number: [id number] Number: Treating RN: Clover MealyAfful, RN, BSN, Rita 10/23/1945 [phone](70 y.o. Other Clinician: Date of Birth/Sex: Female) Treating ROBSON, MICHAEL Primary Care Provider: Ruffin PyoBIN, REBECCA Provider/Extender: G Referring Provider: Ruffin PyoBIN, REBECCA Weeks in Treatment: 2 Debridement Performed for Wound #1 Right Toe Great Assessment: Performed By: Physician Maxwell CaulOBSON, MICHAEL G, MD Debridement: Debridement Severity of Tissue Pre Fat layer exposed Debridement: Pre-procedure Verification/Time Out Yes - 14:48 Taken: Start Time: 14:28 Pain Control: Lidocaine 4% Topical Solution Level: Skin/Subcutaneous Tissue Total Area Debrided (L x 1.1 (cm) x 0.4 (cm) = 0.44 (cm) W): Tissue and other Non-Viable, Fat, Fibrin/Slough, Subcutaneous material debrided: Instrument: Curette Bleeding: Minimum Hemostasis Achieved: Pressure End Time: 14:33 Procedural Pain: 0 Post Procedural Pain: 0 Response to Treatment: Procedure was tolerated well Post Debridement Measurements of Total Wound Length: (cm) 1.1 Width: (cm) 0.4 Depth: (cm) 0.3 Volume: (cm) 0.104 Character of Wound/Ulcer Post Stable Debridement: Severity of Tissue Post Debridement: Fat layer exposed Post Procedure Diagnosis Same as Pre-procedure Theresa Jensen, Theresa Jensen ([id number]) Electronic Signature(s) Signed: 11/24/2016 4:37:25 PM By: Elpidio EricAfful, Rita BSN, RN Signed: 11/24/2016 6:25:53 PM By: Baltazar Najjarobson, Michael MD Entered By: Baltazar Najjarobson, Michael on 11/24/2016 14:53:53 Theresa Jensen, Theresa Jensen ([id number]) -------------------------------------------------------------------------------- HPI Details Patient Name: Theresa Jensen, Dafina Date of Service: 11/24/2016 2:00 PM Medical Record Patient Account Number: [id number] Number: Treating RN: Clover MealyAfful, RN, BSN, Rita 10/23/1945  [phone](70 y.o. Other Clinician: Date of Birth/Sex: Female) Treating ROBSON, MICHAEL Primary Care Provider: Ruffin PyoBIN, REBECCA Provider/Extender: G Referring Provider: Ruffin PyoBIN, REBECCA Weeks in Treatment: 2 History of Present Illness HPI Description: 11/10/16 patient presents for evaluation as referral from her primary care provider, Ruffin Pyoebecca Tobin, MD who has been treating her right great toe wound. Unfortunately she has had issues with diabetic control and they are working on that. Nonetheless her hemoglobin A1c on last check which was April 2018 with 8.2. She did have an x-ray which was performed and evaluated by her primary on 09/07/16 which is commented in the note to show no bone involvement. I'll see this is good news. Mainly she has been performing wet to dry packing of the wound and on the Sep 07, 2016 encounter it was noted that her primary did pair down some of the callus surrounding the wound. However no excisional debridement of the wound margins especially where it is immediately undermining and rolled is noted. She has been on two rounds of antibiotics clindamycin and Cipro fortunately there does not appear to be any sign of infection at this point. 11/17/16; patient admitted to the clinic last week with a diabetic foot ulcer on the plantar right great toe 11/24/16; plantar aspect of her right great toe dfu Wagner grade 2. Using [employer] Electronic Signature(s) Signed: 11/24/2016 6:25:53 PM By: Baltazar Najjarobson, Michael MD Entered By: Baltazar Najjarobson, Michael on 11/24/2016 14:54:43 Theresa Jensen, Theresa Jensen ([id number]) -------------------------------------------------------------------------------- Physical Exam Details Patient Name: Theresa Jensen, Theresa Jensen Date of Service: 11/24/2016 2:00 PM Medical Record Patient Account Number: [id number] Number: Treating RN: Clover MealyAfful, RN, BSN, Rita 10/23/1945 [phone](70 y.o. Other Clinician: Date of Birth/Sex: Female) Treating ROBSON, MICHAEL Primary Care Provider: Ruffin PyoBIN,  REBECCA Provider/Extender: G Referring Provider: Ruffin PyoBIN, REBECCA Weeks in Treatment: 2 Constitutional Sitting or standing Blood Pressure is within target range for patient.. Pulse regular and within target range for patient.Marland Kitchen. Respirations regular, non-labored and within target range.. Temperature is normal and within the target range for the patient.Marland Kitchen. appears in no distress. Eyes Conjunctivae clear. No discharge.  Cardiovascular Pedal pulses palpable and strong bilaterally.. Notes Wound exam; right great toe plantar aspect. Again using a #3 curet removal of callus skin and subcutaneous tissue and surface slough over the wound bed. Hemostasis with direct pressure. She has undermining superiorly otherwise the wound looks satisfactory Electronic Signature(s) Signed: 11/24/2016 6:25:53 PM By: Baltazar Najjarobson, Michael MD Entered By: Baltazar Najjarobson, Michael on 11/24/2016 14:55:45 Theresa Jensen, Theresa Jensen ([id number]) -------------------------------------------------------------------------------- Physician Orders Details Patient Name: Theresa Jensen, Theresa Jensen Date of Service: 11/24/2016 2:00 PM Medical Record Patient Account Number: [id number] Number: Treating RN: Clover MealyAfful, RN, BSN, Rita 21-Nov-1945 [phone](70 y.o. Other Clinician: Date of Birth/Sex: Female) Treating ROBSON, MICHAEL Primary Care Provider: Ruffin PyoBIN, REBECCA Provider/Extender: G Referring Provider: Ruffin PyoBIN, REBECCA Weeks in Treatment: 2 Verbal / Phone Orders: No Diagnosis Coding Wound Cleansing Wound #1 Right Toe Great o Cleanse wound with mild soap and water o May Shower, gently pat wound dry prior to applying new dressing. o May shower with protection. Anesthetic Wound #1 Right Toe Great o Topical Lidocaine 4% cream applied to wound bed prior to debridement - in clinic Primary Wound Dressing Wound #1 Right Toe Great o Prisma Ag Secondary Dressing Wound #1 Right Toe Great o Gauze and Kerlix/Conform Dressing Change Frequency Wound #1 Right  Toe Great o Change dressing every other day. Follow-up Appointments Wound #1 Right Toe Great o Return Appointment in 1 week. Off-Loading Wound #1 Right Toe Great o Open toe surgical shoe with peg assist. Additional Orders / Instructions Wound #1 Right Toe Great o Increase protein intake. Theresa Jensen, Theresa Jensen ([id number]) o Activity as tolerated Electronic Signature(s) Signed: 11/24/2016 4:37:25 PM By: Elpidio EricAfful, Rita BSN, RN Signed: 11/24/2016 6:25:53 PM By: Baltazar Najjarobson, Michael MD Entered By: Elpidio EricAfful, Rita on 11/24/2016 14:30:42 Theresa Jensen, [place] ([id number]) -------------------------------------------------------------------------------- Problem List Details Patient Name: Theresa Jensen, Isla Date of Service: 11/24/2016 2:00 PM Medical Record Patient Account Number: [id number] Number: Treating RN: Clover MealyAfful, RN, BSN, Rita 21-Nov-1945 [phone](70 y.o. 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 E11.40 Type 2 diabetes mellitus with diabetic neuropathy, 11/11/2016 Yes unspecified L03.031 Cellulitis of right toe 11/11/2016 Yes Inactive Problems Resolved Problems Electronic Signature(s) Signed: 11/24/2016 6:25:53 PM By: Baltazar Najjarobson, Michael MD Entered By: Baltazar Najjarobson, Michael on 11/24/2016 14:53:04 Theresa Jensen, [place] ([id number]) -------------------------------------------------------------------------------- Progress Note Details Patient Name: Theresa Jensen, Mariaeduarda Date of Service: 11/24/2016 2:00 PM Medical Record Patient Account Number: [id number] Number: Treating RN: Clover MealyAfful, RN, BSN, Rita 21-Nov-1945 [phone](70 y.o. Other  Clinician: Date of Birth/Sex: Female) Treating ROBSON, MICHAEL Primary Care Provider: Ruffin PyoBIN, REBECCA Provider/Extender: G Referring Provider: Ruffin PyoBIN, REBECCA Weeks in Treatment: 2 Subjective History of Present Illness (HPI) 11/10/16 patient presents for evaluation as referral from her primary care provider, Ruffin Pyoebecca Tobin, MD who has been treating her right great toe wound. Unfortunately she has had issues with diabetic control and they are working on that. Nonetheless her hemoglobin A1c on last check which was April 2018 with 8.2. She did have an x-ray which was performed and evaluated by her primary on 09/07/16 which is commented in the note to show no bone involvement. I'll see this is good news. Mainly she has been performing wet to dry packing of the wound and on the Sep 07, 2016 encounter it was noted that her primary did pair down  some of the callus surrounding the wound. However no excisional debridement of the wound margins especially where it is immediately undermining and rolled is noted. She has been on two rounds of antibiotics clindamycin and Cipro fortunately there does not appear to be any sign of infection at this point. 11/17/16; patient admitted to the clinic last week with a diabetic foot ulcer on the plantar right great toe 11/24/16; plantar aspect of her right great toe dfu Wagner grade 2. Using Prisma Objective Constitutional Sitting or standing Blood Pressure is within target range for patient.. Pulse regular and within target range for patient.Marland Kitchen Respirations regular, non-labored and within target range.. Temperature is normal and within the target range for the patient.Marland Kitchen appears in no distress. Vitals Time Taken: 1:57 PM, Temperature: 97.8 F, Pulse: 79 bpm, Respiratory Rate: 16 breaths/min, Blood Pressure: 118/53 mmHg. Eyes Conjunctivae clear. No discharge. Cardiovascular Pedal pulses palpable and strong bilaterally.Joanne Gavel, [name] ([id number]) General Notes: Wound  exam; right great toe plantar aspect. Again using a #3 curet removal of callus skin and subcutaneous tissue and surface slough over the wound bed. Hemostasis with direct pressure. She has undermining superiorly otherwise the wound looks satisfactory Integumentary (Hair, Skin) Wound #1 status is Open. Original cause of wound was Gradually Appeared. The wound is located on the Right Toe Great. The wound measures 1.1cm length x 0.4cm width x 0.3cm depth; 0.346cm^2 area and 0.104cm^3 volume. There is Fat Layer (Subcutaneous Tissue) Exposed exposed. There is no tunneling or undermining noted. There is a medium amount of serosanguineous drainage noted. The wound margin is distinct with the outline attached to the wound base. There is medium (34-66%) pink, pale granulation within the wound bed. There is a small (1-33%) amount of necrotic tissue within the wound bed including Adherent Slough. The periwound skin appearance exhibited: Callus. The periwound skin appearance did not exhibit: Crepitus, Excoriation, Induration, Rash, Scarring, Dry/Scaly, Maceration, Atrophie Blanche, Cyanosis, Ecchymosis, Hemosiderin Staining, Mottled, Pallor, Rubor, Erythema. Periwound temperature was noted as No Abnormality. Assessment Active Problems ICD-10 E11.621 - Type 2 diabetes mellitus with foot ulcer L97.512 - Non-pressure chronic ulcer of other part of right foot with fat layer exposed E11.40 - Type 2 diabetes mellitus with diabetic neuropathy, unspecified L03.031 - Cellulitis of right toe Procedures Wound #1 Pre-procedure diagnosis of Wound #1 is a Diabetic Wound/Ulcer of the Lower Extremity located on the Right Toe Great .Severity of Tissue Pre Debridement is: Fat layer exposed. There was a Skin/Subcutaneous Tissue Debridement ([id number]) debridement with total area of 0.44 sq cm performed by Maxwell Caul, MD. with the following instrument(s): Curette to remove Non-Viable tissue/material including Fat  Layer (and Subcutaneous Tissue) Exposed, Fibrin/Slough, and Subcutaneous after achieving pain control using Lidocaine 4% Topical Solution. A time out was conducted at 14:48, prior to the start of the procedure. A Minimum amount of bleeding was controlled with Pressure. The procedure was tolerated well with a pain level of 0 throughout and a pain level of 0 following the procedure. Post Debridement Measurements: 1.1cm length x 0.4cm width x 0.3cm depth; 0.104cm^3 volume. Character of Wound/Ulcer Post Debridement is stable. Severity of Tissue Post Debridement is: Fat layer exposed. [place], [name] ([id number]) Post procedure Diagnosis Wound #1: Same as Pre-Procedure Plan Wound Cleansing: Wound #1 Right Toe Great: Cleanse wound with mild soap and water May Shower, gently pat wound dry prior to applying new dressing. May shower with protection. Anesthetic: Wound #1 Right Toe Great: Topical Lidocaine 4% cream applied to wound bed prior to debridement -  in clinic Primary Wound Dressing: Wound #1 Right Toe Great: Prisma Ag Secondary Dressing: Wound #1 Right Toe Great: Gauze and Kerlix/Conform Dressing Change Frequency: Wound #1 Right Toe Great: Change dressing every other day. Follow-up Appointments: Wound #1 Right Toe Great: Return Appointment in 1 week. Off-Loading: Wound #1 Right Toe Great: Open toe surgical shoe with peg assist. Additional Orders / Instructions: Wound #1 Right Toe Great: Increase protein intake. Activity as tolerated Continue prisma darco forefoot offloader [employer] Electronic Signature(s) Signed: 11/24/2016 6:25:53 PM By: Baltazar Najjar MD [place], [name] ([id number]) Entered By: Baltazar Najjar on 11/24/2016 14:56:28 Theresa Jensen ([id number]) -------------------------------------------------------------------------------- SuperBill Details Patient Name: Theresa Jensen Date of Service: 11/24/2016 Medical Record Patient Account Number:  [id number] Number: Treating RN: Clover Mealy RN, BSN, Rita [birth year] [phone] y.o. Other Clinician: Date of Birth/Sex: Female) Treating ROBSON, MICHAEL Primary Care Provider: Ruffin Pyo Provider/Extender: G Referring Provider: Ruffin Pyo Weeks in Treatment: 2 Diagnosis Coding ICD-10 Codes Code Description E11.621 Type 2 diabetes mellitus with foot ulcer L97.512 Non-pressure chronic ulcer of other part of right foot with fat layer exposed E11.40 Type 2 diabetes mellitus with diabetic neuropathy, unspecified L03.031 Cellulitis of right toe Facility Procedures CPT4 Code Description: [id number] 11042 - DEB SUBQ TISSUE 20 SQ CM/< ICD-10 Description Diagnosis E11.621 Type 2 diabetes mellitus with foot ulcer L97.512 Non-pressure chronic ulcer of other part of right fo Modifier: ot with fat la Quantity: 1 yer exposed Physician Procedures CPT4 Code Description: [id number] 11042 - WC PHYS SUBQ TISS 20 SQ CM ICD-10 Description Diagnosis E11.621 Type 2 diabetes mellitus with foot ulcer L97.512 Non-pressure chronic ulcer of other part of right fo Modifier: ot with fat lay Quantity: 1 er exposed Electronic Signature(s) Signed: 11/24/2016 6:25:53 PM By: Baltazar Najjar MD Entered By: Baltazar Najjar on 11/24/2016 14:56:56

## 2016-12-01 ENCOUNTER — Encounter: Payer: Medicare Other | Attending: Internal Medicine | Admitting: Internal Medicine

## 2016-12-01 DIAGNOSIS — I10 Essential (primary) hypertension: Secondary | ICD-10-CM | POA: Insufficient documentation

## 2016-12-01 DIAGNOSIS — M1990 Unspecified osteoarthritis, unspecified site: Secondary | ICD-10-CM | POA: Insufficient documentation

## 2016-12-01 DIAGNOSIS — E11621 Type 2 diabetes mellitus with foot ulcer: Secondary | ICD-10-CM | POA: Diagnosis not present

## 2016-12-01 DIAGNOSIS — Z87891 Personal history of nicotine dependence: Secondary | ICD-10-CM | POA: Insufficient documentation

## 2016-12-01 DIAGNOSIS — E1140 Type 2 diabetes mellitus with diabetic neuropathy, unspecified: Secondary | ICD-10-CM | POA: Insufficient documentation

## 2016-12-01 DIAGNOSIS — Z7984 Long term (current) use of oral hypoglycemic drugs: Secondary | ICD-10-CM | POA: Diagnosis not present

## 2016-12-01 DIAGNOSIS — L97512 Non-pressure chronic ulcer of other part of right foot with fat layer exposed: Secondary | ICD-10-CM | POA: Insufficient documentation

## 2016-12-03 NOTE — Progress Notes (Signed)
Theresa Jensen, Theresa Jensen ([id number]) Visit Report for 12/01/2016 Arrival Information Details Patient Name: Theresa Jensen, Theresa Jensen Date of Service: 12/01/2016 2:00 PM Medical Record Patient Account Number: [id number] Number: Treating RN: Clover MealyAfful, RN, BSN, Rita [birth year] [phone](70 y.o. Other Clinician: Date of Birth/Sex: Female) Treating ROBSON, MICHAEL Primary Care Momina Hunton: Ruffin PyoBIN, REBECCA Taiya Nutting/Extender: G Referring Oree Hislop: Ruffin PyoBIN, REBECCA Weeks in Treatment: 3 Visit Information History Since Last Visit All ordered tests and consults were completed: No Patient Arrived: Ambulatory Added or deleted any medications: No Arrival Time: 13:55 Any new allergies or adverse reactions: No Accompanied By: self Had a fall or experienced change in No Transfer Assistance: None activities of daily living that may affect Patient Identification Verified: Yes risk of falls: Secondary Verification Process Yes Signs or symptoms of abuse/neglect since last No Completed: visito Patient Requires Transmission- No Hospitalized since last visit: No Based Precautions: Has Dressing in Place as Prescribed: Yes Patient Has Alerts: Yes Has Footwear/Offloading in Place as Yes Patient Alerts: Patient on Blood Prescribed: Thinner Left: Wedge Hgb A1C 8.2 Shoe (may) Pain Present Now: No Electronic Signature(s) Signed: 12/01/2016 4:44:32 PM By: Elpidio EricAfful, Rita BSN, RN Entered By: Elpidio EricAfful, Rita on 12/01/2016 13:57:11 Theresa Jensen, Theresa Jensen ([id number]) -------------------------------------------------------------------------------- Encounter Discharge Information Details Patient Name: Theresa Jensen, Theresa Jensen Date of Service: 12/01/2016 2:00 PM Medical Record Patient Account Number: [id number] Number: Treating RN: Clover MealyAfful, RN, BSN, Rita [birth year] [phone](71 y.o. Other Clinician: Date of Birth/Sex: Female) Treating ROBSON, MICHAEL Primary Care Oather Muilenburg: Ruffin PyoBIN, REBECCA Kasheem Toner/Extender: G Referring Deicy Rusk: Ruffin PyoBIN, REBECCA Weeks in  Treatment: 3 Encounter Discharge Information Items Discharge Pain Level: 0 Discharge Condition: Stable Ambulatory Status: Ambulatory Discharge Destination: Home Transportation: Private Auto Schedule Follow-up Appointment: No Medication Reconciliation completed and provided to Patient/Care No Lakendra Helling: Provided on Clinical Summary of Care: 12/01/2016 Form Type Recipient Paper Patient DS Electronic Signature(s) Signed: 12/01/2016 4:44:32 PM By: Elpidio EricAfful, Rita BSN, RN Previous Signature: 12/01/2016 2:27:07 PM Version By: Gwenlyn PerkingMoore, Shelia Entered By: Elpidio EricAfful, Rita on 12/01/2016 14:29:26 Theresa Jensen, Theresa Jensen ([id number]) -------------------------------------------------------------------------------- Lower Extremity Assessment Details Patient Name: Theresa Jensen, Theresa Jensen Date of Service: 12/01/2016 2:00 PM Medical Record Patient Account Number: [id number] Number: Treating RN: Clover MealyAfful, RN, BSN, Rita [birth year] [phone](71 y.o. Other Clinician: Date of Birth/Sex: Female) Treating ROBSON, MICHAEL Primary Care Federick Levene: Ruffin PyoBIN, REBECCA Jessic Standifer/Extender: G Referring Wyoma Genson: Ruffin PyoBIN, REBECCA Weeks in Treatment: 3 Vascular Assessment Claudication: Claudication Assessment [Right:None] Pulses: Dorsalis Pedis Palpable: [Right:Yes] Posterior Tibial Extremity colors, hair growth, and conditions: Extremity Color: [Right:Mottled] Hair Growth on Extremity: [Right:No] Temperature of Extremity: [Right:Warm] Capillary Refill: [Right:< 3 seconds] Electronic Signature(s) Signed: 12/01/2016 4:44:32 PM By: Elpidio EricAfful, Rita BSN, RN Entered By: Elpidio EricAfful, Rita on 12/01/2016 13:59:31 Theresa Jensen, Theresa Jensen ([id number]) -------------------------------------------------------------------------------- Multi Wound Chart Details Patient Name: Theresa Jensen, Theresa Jensen Date of Service: 12/01/2016 2:00 PM Medical Record Patient Account Number: [id number] Number: Treating RN: Clover MealyAfful, RN, BSN, Rita [birth year] [phone](71 y.o. Other Clinician: Date of  Birth/Sex: Female) Treating ROBSON, MICHAEL Primary Care Daryle Boyington: Ruffin PyoBIN, REBECCA Corean Yoshimura/Extender: G Referring Icess Bertoni: Ruffin PyoBIN, REBECCA Weeks in Treatment: 3 Vital Signs Height(in): Pulse(bpm): 86 Weight(lbs): Blood Pressure 110/52 (mmHg): Body Mass Index(BMI): Temperature(F): 98.3 Respiratory Rate 18 (breaths/min): Photos: [1:No Photos] [N/A:N/A] Wound Location: [1:Right Toe Great] [N/A:N/A] Wounding Event: [1:Gradually Appeared] [N/A:N/A] Primary Etiology: [1:Diabetic Wound/Ulcer of N/A the Lower Extremity] Comorbid History: [1:Anemia, Hypertension, Type II Diabetes, Osteoarthritis, Neuropathy] [N/A:N/A] Date Acquired: [1:07/06/2016] [N/A:N/A] Weeks of Treatment: [1:3] [N/A:N/A] Wound Status: [1:Open] [N/A:N/A] Measurements L x W x D 1.1x0.5x0.3 [N/A:N/A] (cm) Area (cm) : [1:0.432] [N/A:N/A] Volume (cm) : [1:0.13] [N/A:N/A] % Reduction in Area: [1:-5.90%] [N/A:N/A] % Reduction in  Volume: 36.30% [N/A:N/A] Classification: [1:Grade 1] [N/A:N/A] Exudate Amount: [1:Medium] [N/A:N/A] Exudate Type: [1:Serosanguineous] [N/A:N/A] Exudate Color: [1:red, brown] [N/A:N/A] Wound Margin: [1:Distinct, outline attached N/A] Granulation Amount: [1:Medium (34-66%)] [N/A:N/A] Granulation Quality: [1:Pink, Pale] [N/A:N/A] Necrotic Amount: [1:Small (1-33%)] [N/A:N/A] Exposed Structures: [1:Fat Layer (Subcutaneous N/A Tissue) Exposed: Yes Fascia: No] Tendon: No Muscle: No Joint: No Bone: No Epithelialization: Small (1-33%) N/A N/A Debridement: Debridement ([id number](11042- N/A N/A 11047) Pre-procedure 14:14 N/A N/A Verification/Time Out Taken: Pain Control: Lidocaine 4% Topical N/A N/A Solution Tissue Debrided: Fibrin/Slough, N/A N/A Subcutaneous Level: Skin/Subcutaneous N/A N/A Tissue Debridement Area (sq 0.55 N/A N/A cm): Instrument: Curette N/A N/A Bleeding: Minimum N/A N/A Hemostasis Achieved: Pressure N/A N/A Procedural Pain: 0 N/A N/A Post Procedural Pain: 0 N/A  N/A Debridement Treatment Procedure was tolerated N/A N/A Response: well Post Debridement 1.1x0.5x3 N/A N/A Measurements L x W x D (cm) Post Debridement 1.296 N/A N/A Volume: (cm) Periwound Skin Texture: Callus: Yes N/A N/A Excoriation: No Induration: No Crepitus: No Rash: No Scarring: No Periwound Skin Maceration: No N/A N/A Moisture: Dry/Scaly: No Periwound Skin Color: Atrophie Blanche: No N/A N/A Cyanosis: No Ecchymosis: No Erythema: No Hemosiderin Staining: No Mottled: No Pallor: No Rubor: No Temperature: No Abnormality N/A N/A Tenderness on No N/A N/A Palpation: Wound Preparation: N/A N/A Theresa Jensen, Theresa Jensen ([id number]) Ulcer Cleansing: Rinsed/Irrigated with Saline Topical Anesthetic Applied: Other: lidocaine 4% Procedures Performed: Debridement N/A N/A Treatment Notes Electronic Signature(s) Signed: 12/02/2016 3:50:17 PM By: Baltazar Najjarobson, Michael MD Entered By: Baltazar Najjarobson, Michael on 12/01/2016 14:19:56 Theresa Jensen, Trang ([id number]) -------------------------------------------------------------------------------- Multi-Disciplinary Care Plan Details Patient Name: Theresa Jensen, Mystery Date of Service: 12/01/2016 2:00 PM Medical Record Patient Account Number: [id number] Number: Treating RN: Clover MealyAfful, RN, BSN, Rita 1945/06/04 [phone](70 y.o. Other Clinician: Date of Birth/Sex: Female) Treating ROBSON, MICHAEL Primary Care Elliannah Wayment: Ruffin PyoBIN, REBECCA Nadege Carriger/Extender: G Referring Remedy Corporan: Ruffin PyoBIN, REBECCA Weeks in Treatment: 3 Active Inactive ` Orientation to the Wound Care Program Nursing Diagnoses: Knowledge deficit related to the wound healing center program Goals: Patient/caregiver will verbalize understanding of the Wound Healing Center Program Date Initiated: 11/10/2016 Target Resolution Date: 03/13/2017 Goal Status: Active Interventions: Provide education on orientation to the wound center Notes: ` Peripheral Neuropathy Nursing Diagnoses: Knowledge deficit related  to disease process and management of peripheral neurovascular dysfunction Potential alteration in peripheral tissue perfusion (select prior to confirmation of diagnosis) Goals: Patient/caregiver will verbalize understanding of disease process and disease management Date Initiated: 11/10/2016 Target Resolution Date: 03/13/2017 Goal Status: Active Interventions: Assess signs and symptoms of neuropathy upon admission and as needed Provide education on Management of Neuropathy and Related Ulcers Provide education on Management of Neuropathy upon discharge from the Wound Center Notes: Theresa Jensen` Elkhatib, Jaedyn ([id number]) Pressure Nursing Diagnoses: Knowledge deficit related to management of pressures ulcers Potential for impaired tissue integrity related to pressure, friction, moisture, and shear Goals: Patient will remain free from development of additional pressure ulcers Date Initiated: 11/10/2016 Target Resolution Date: 03/13/2017 Goal Status: Active Patient will remain free of pressure ulcers Date Initiated: 11/10/2016 Target Resolution Date: 03/13/2017 Goal Status: Active Patient/caregiver will verbalize risk factors for pressure ulcer development Date Initiated: 11/10/2016 Target Resolution Date: 03/13/2017 Goal Status: Active Patient/caregiver will verbalize understanding of pressure ulcer management Date Initiated: 11/10/2016 Target Resolution Date: 03/13/2017 Goal Status: Active Interventions: Assess: immobility, friction, shearing, incontinence upon admission and as needed Assess offloading mechanisms upon admission and as needed Assess potential for pressure ulcer upon admission and as needed Provide education on pressure ulcers Treatment Activities: Patient referred for home evaluation of offloading devices/mattresses :  11/10/2016 Patient referred for pressure reduction/relief devices : 11/10/2016 Notes: ` Wound/Skin Impairment Nursing Diagnoses: Impaired tissue  integrity Knowledge deficit related to ulceration/compromised skin integrity Goals: Patient/caregiver will verbalize understanding of skin care regimen Date Initiated: 11/10/2016 Target Resolution Date: 03/13/2017 Goal Status: Active Ulcer/skin breakdown will have a volume reduction of 30% by week 4 Date Initiated: 11/10/2016 Target Resolution Date: 03/13/2017 Goal Status: Active WILMINA, MAXHAM ([id number]) Ulcer/skin breakdown will have a volume reduction of 50% by week 8 Date Initiated: 11/10/2016 Target Resolution Date: 03/13/2017 Goal Status: Active Ulcer/skin breakdown will have a volume reduction of 80% by week 12 Date Initiated: 11/10/2016 Target Resolution Date: 03/13/2017 Goal Status: Active Ulcer/skin breakdown will heal within 14 weeks Date Initiated: 11/10/2016 Target Resolution Date: 03/13/2017 Goal Status: Active Interventions: Assess patient/caregiver ability to obtain necessary supplies Assess patient/caregiver ability to perform ulcer/skin care regimen upon admission and as needed Assess ulceration(s) every visit Provide education on ulcer and skin care Treatment Activities: Referred to DME Jahliyah Trice for dressing supplies : 11/10/2016 Skin care regimen initiated : 11/10/2016 Topical wound management initiated : 11/10/2016 Notes: Electronic Signature(s) Signed: 12/01/2016 4:44:32 PM By: Elpidio Eric BSN, RN Entered By: Elpidio Eric on 12/01/2016 14:02:36 Theresa Jensen ([id number]) -------------------------------------------------------------------------------- Pain Assessment Details Patient Name: Theresa Jensen Date of Service: 12/01/2016 2:00 PM Medical Record Patient Account Number: [id number] Number: Treating RN: Clover Mealy, RN, BSN, Rita [birth year] [phone] y.o. Other Clinician: Date of Birth/Sex: Female) Treating ROBSON, MICHAEL Primary Care Kareema Keitt: Ruffin Pyo Mikey Maffett/Extender: G Referring Michaell Grider: Ruffin Pyo Weeks in Treatment: 3 Active  Problems Location of Pain Severity and Description of Pain Patient Has Paino No Site Locations With Dressing Change: No Pain Management and Medication Current Pain Management: Electronic Signature(s) Signed: 12/01/2016 4:44:32 PM By: Elpidio Eric BSN, RN Entered By: Elpidio Eric on 12/01/2016 13:58:44 Theresa Jensen ([id number]) -------------------------------------------------------------------------------- Patient/Caregiver Education Details Patient Name: Theresa Jensen Date of Service: 12/01/2016 2:00 PM Medical Record Patient Account Number: [id number] Number: Treating RN: Clover Mealy, RN, BSN, Rita 1945/06/19 [phone] y.o. Other Clinician: Date of Birth/Gender: Female) Treating ROBSON, MICHAEL Primary Care Physician/Extender: Milana Kidney, REBECCA Physician: Tania Ade in Treatment: 3 Referring Physician: Ruffin Pyo Education Assessment Education Provided To: Patient Education Topics Provided Peripheral Neuropathy: Methods: Explain/Verbal Pressure: Methods: Explain/Verbal Responses: Reinforcements needed Welcome To The Wound Care Center: Methods: Explain/Verbal Responses: Reinforcements needed Wound/Skin Impairment: Methods: Explain/Verbal Responses: Reinforcements needed Electronic Signature(s) Signed: 12/01/2016 4:44:32 PM By: Elpidio Eric BSN, RN Entered By: Elpidio Eric on 12/01/2016 14:29:53 Theresa Jensen ([id number]) -------------------------------------------------------------------------------- Wound Assessment Details Patient Name: Theresa Jensen Date of Service: 12/01/2016 2:00 PM Medical Record Patient Account Number: [id number] Number: Treating RN: Clover Mealy, RN, BSN, Rita Jan 08, 1946 [phone] y.o. Other Clinician: Date of Birth/Sex: Female) Treating ROBSON, MICHAEL Primary Care Jarold Macomber: Ruffin Pyo Landis Dowdy/Extender: G Referring Jemuel Laursen: Ruffin Pyo Weeks in Treatment: 3 Wound Status Wound Number: 1 Primary Diabetic Wound/Ulcer of the  Lower Etiology: Extremity Wound Location: Right Toe Great Wound Open Wounding Event: Gradually Appeared Status: Date Acquired: 07/06/2016 Comorbid Anemia, Hypertension, Type II Weeks Of Treatment: 3 History: Diabetes, Osteoarthritis, Neuropathy Clustered Wound: No Photos Photo Uploaded By: Elpidio Eric on 12/01/2016 16:43:01 Wound Measurements Length: (cm) 1.1 Width: (cm) 0.5 Depth: (cm) 0.3 Area: (cm) 0.432 Volume: (cm) 0.13 % Reduction in Area: -5.9% % Reduction in Volume: 36.3% Epithelialization: Small (1-33%) Tunneling: No Undermining: No Wound Description Classification: Grade 1 Foul Odor Aft Wound Margin: Distinct, outline attached Slough/Fibrin Exudate Amount: Medium Exudate Type: Serosanguineous Exudate Color: red, brown er Cleansing: No o Yes Wound Bed Granulation  Amount: Medium (34-66%) Exposed Structure Granulation Quality: Pink, Pale Fascia Exposed: No Necrotic Amount: Small (1-33%) Fat Layer (Subcutaneous Tissue) Exposed: Yes Baynes, [name] ([id number]) Necrotic Quality: Adherent Slough Tendon Exposed: No Muscle Exposed: No Joint Exposed: No Bone Exposed: No Periwound Skin Texture Texture Color No Abnormalities Noted: No No Abnormalities Noted: No Callus: Yes Atrophie Blanche: No Crepitus: No Cyanosis: No Excoriation: No Ecchymosis: No Induration: No Erythema: No Rash: No Hemosiderin Staining: No Scarring: No Mottled: No Pallor: No Moisture Rubor: No No Abnormalities Noted: No Dry / Scaly: No Temperature / Pain Maceration: No Temperature: No Abnormality Wound Preparation Ulcer Cleansing: Rinsed/Irrigated with Saline Topical Anesthetic Applied: Other: lidocaine 4%, Treatment Notes Wound #1 (Right Toe Great) 1. Cleansed with: Clean wound with Normal Saline 4. Dressing Applied: Prisma Ag 5. Secondary Dressing Applied Gauze and Kerlix/Conform 6. Footwear/Offloading device applied Other footwear/offloading device applied  (specify in notes) 7. Secured with Tape Notes front offload Electronic Signature(s) Signed: 12/01/2016 4:44:32 PM By: Elpidio Eric BSN, RN Entered By: Elpidio Eric on 12/01/2016 14:02:25 Theresa Jensen ([id number]) -------------------------------------------------------------------------------- Vitals Details Patient Name: Theresa Jensen Date of Service: 12/01/2016 2:00 PM Medical Record Patient Account Number: [id number] Number: Treating RN: Clover Mealy, RN, BSN, Rita 1945-10-24 [phone] y.o. Other Clinician: Date of Birth/Sex: Female) Treating ROBSON, MICHAEL Primary Care Heyden Jaber: Ruffin Pyo Aila Terra/Extender: G Referring Esperanza Madrazo: Ruffin Pyo Weeks in Treatment: 3 Vital Signs Time Taken: 13:58 Temperature (F): 98.3 Pulse (bpm): 86 Respiratory Rate (breaths/min): 18 Blood Pressure (mmHg): 110/52 Reference Range: 80 - 120 mg / dl Electronic Signature(s) Signed: 12/01/2016 4:44:32 PM By: Elpidio Eric BSN, RN Entered By: Elpidio Eric on 12/01/2016 13:59:46

## 2016-12-06 NOTE — Progress Notes (Signed)
Theresa Jensen, Orvetta ([id number]) Visit Report for 12/01/2016 Chief Complaint Document Details Patient Name: Theresa Jensen, Theresa Jensen Date of Service: 12/01/2016 2:00 PM Medical Record Patient Account Number: [id number] Number: Treating RN: Clover MealyAfful, RN, BSN, Rita 01-18-1946 [phone](71 y.o. Other Clinician: Date of Birth/Sex: Female) Treating Anterrio Mccleery Primary Care Provider: Ruffin PyoBIN, REBECCA Provider/Extender: G Referring Provider: Ruffin PyoBIN, REBECCA Weeks in Treatment: 3 Information Obtained from: Patient Chief Complaint Right Great Toe Ulcer Electronic Signature(s) Signed: 12/02/2016 3:50:17 PM By: Baltazar Najjarobson, Daylan Juhnke MD Entered By: Baltazar Najjarobson, Jonas Goh on 12/01/2016 14:20:25 Theresa Jensen, [name] ([id number]) -------------------------------------------------------------------------------- Debridement Details Patient Name: Theresa Jensen, Theresa Jensen Date of Service: 12/01/2016 2:00 PM Medical Record Patient Account Number: [id number] Number: Treating RN: Clover MealyAfful, RN, BSN, Rita 01-18-1946 [phone](71 y.o. Other Clinician: Date of Birth/Sex: Female) Treating Carey Lafon Primary Care Provider: Ruffin PyoBIN, REBECCA Provider/Extender: G Referring Provider: Ruffin PyoBIN, REBECCA Weeks in Treatment: 3 Debridement Performed for Wound #1 Right Toe Great Assessment: Performed By: Physician Maxwell CaulOBSON, Tykerria Mccubbins G, MD Debridement: Debridement Severity of Tissue Pre Fat layer exposed Debridement: Pre-procedure Verification/Time Out Yes - 14:14 Taken: Start Time: 14:14 Pain Control: Lidocaine 4% Topical Solution Level: Skin/Subcutaneous Tissue Total Area Debrided (L x 1.1 (cm) x 0.5 (cm) = 0.55 (cm) W): Tissue and other Non-Viable, Fibrin/Slough, Subcutaneous material debrided: Instrument: Curette Bleeding: Minimum Hemostasis Achieved: Pressure End Time: 14:16 Procedural Pain: 0 Post Procedural Pain: 0 Response to Treatment: Procedure was tolerated well Post Debridement Measurements of Total Wound Length: (cm) 1.1 Width:  (cm) 0.5 Depth: (cm) 3 Volume: (cm) 1.296 Character of Wound/Ulcer Post Stable Debridement: Severity of Tissue Post Debridement: Fat layer exposed Post Procedure Diagnosis Same as Pre-procedure Electronic Signature(s) Theresa Jensen, Teniya ([id number]) Signed: 12/01/2016 4:44:32 PM By: Elpidio EricAfful, Rita BSN, RN Signed: 12/02/2016 3:50:17 PM By: Baltazar Najjarobson, Jen Eppinger MD Entered By: Baltazar Najjarobson, Brentton Wardlow on 12/01/2016 14:20:14 Theresa Jensen, Theresa Jensen ([id number]) -------------------------------------------------------------------------------- HPI Details Patient Name: Theresa Jensen, [name] Date of Service: 12/01/2016 2:00 PM Medical Record Patient Account Number: [id number] Number: Treating RN: Clover MealyAfful, RN, BSN, Rita 01-18-1946 [phone](71 y.o. Other Clinician: Date of Birth/Sex: Female) Treating Jisela Merlino Primary Care Provider: Ruffin PyoBIN, REBECCA Provider/Extender: G Referring Provider: Ruffin PyoBIN, REBECCA Weeks in Treatment: 3 History of Present Illness HPI Description: 11/10/16 patient presents for evaluation as referral from her primary care provider, Ruffin Pyoebecca Tobin, MD who has been treating her right great toe wound. Unfortunately she has had issues with diabetic control and they are working on that. Nonetheless her hemoglobin A1c on last check which was April 2018 with 8.2. She did have an x-ray which was performed and evaluated by her primary on 09/07/16 which is commented in the note to show no bone involvement. I'll see this is good news. Mainly she has been performing wet to dry packing of the wound and on the Sep 07, 2016 encounter it was noted that her primary did pair down some of the callus surrounding the wound. However no excisional debridement of the wound margins especially where it is immediately undermining and rolled is noted. She has been on two rounds of antibiotics clindamycin and Cipro fortunately there does not appear to be any sign of infection at this point. 11/17/16; patient admitted to the clinic last  week with a diabetic foot ulcer on the plantar right great toe 11/24/16; plantar aspect of her right great toe dfu Wagner grade 2. Using Prisma 12/01/16; plantar aspect of her right great toe DF U Wagner grade 2 using Prisma without real change. ABI is 1. Previous x-ray showed no bone involvement Electronic Signature(s) Signed: 12/02/2016 3:50:17 PM By: Baltazar Najjarobson, Emme Rosenau MD  Entered By: Baltazar Najjar on 12/01/2016 14:21:37 Theresa Rosales ([id number]) -------------------------------------------------------------------------------- Physical Exam Details Patient Name: SHAGUANA, Theresa Jensen Date of Service: 12/01/2016 2:00 PM Medical Record Patient Account Number: [id number] Number: Treating RN: Clover Mealy, RN, BSN, Rita 1945-06-29 [phone] y.o. Other Clinician: Date of Birth/Sex: Female) Treating Joylene Wescott Primary Care Provider: Ruffin Pyo Provider/Extender: G Referring Provider: Ruffin Pyo Weeks in Treatment: 3 Constitutional Sitting or standing Blood Pressure is within target range for patient.. Pulse regular and within target range for patient.Marland Kitchen Respirations regular, non-labored and within target range.. Temperature is normal and within the target range for the patient.Marland Kitchen appears in no distress. Cardiovascular Pedal pulses palpable and strong bilaterally.. Notes Wound exam; right great toe plantar aspect. Again thick surrounding callus skin and subcutaneous tissue. There is no evidence of surrounding infection no drainage no erythema. Using a #3 curet I again take off his much of the callused skin subcutaneous tissue as I can. Hemostasis was silver nitrate. Electronic Signature(s) Signed: 12/02/2016 3:50:17 PM By: Baltazar Najjar MD Entered By: Baltazar Najjar on 12/01/2016 14:23:14 Theresa Rosales ([id number]) -------------------------------------------------------------------------------- Physician Orders Details Patient Name: Theresa Rosales Date of Service: 12/01/2016 2:00  PM Medical Record Patient Account Number: [id number] Number: Treating RN: Clover Mealy RN, BSN, Rita 03-26-1946 [phone] y.o. Other Clinician: Date of Birth/Sex: Female) Treating Gianina Olinde Primary Care Provider: Ruffin Pyo Provider/Extender: G Referring Provider: Ruffin Pyo Weeks in Treatment: 3 Verbal / Phone Orders: No Diagnosis Coding Wound Cleansing Wound #1 Right Toe Great o Cleanse wound with mild soap and water o May Shower, gently pat wound dry prior to applying new dressing. o May shower with protection. Anesthetic Wound #1 Right Toe Great o Topical Lidocaine 4% cream applied to wound bed prior to debridement - in clinic Primary Wound Dressing Wound #1 Right Toe Great o Prisma Ag Secondary Dressing Wound #1 Right Toe Great o Gauze and Kerlix/Conform Dressing Change Frequency Wound #1 Right Toe Great o Change dressing every other day. Follow-up Appointments Wound #1 Right Toe Great o Return Appointment in 1 week. Off-Loading Wound #1 Right Toe Great o Open toe surgical shoe with peg assist. Additional Orders / Instructions Wound #1 Right Toe Great o Increase protein intake. JANDY, BRACKENS ([id number]) o Activity as tolerated Electronic Signature(s) Signed: 12/01/2016 4:44:32 PM By: Elpidio Eric BSN, RN Signed: 12/02/2016 3:50:17 PM By: Baltazar Najjar MD Entered By: Elpidio Eric on 12/01/2016 14:26:31 Theresa Rosales ([id number]) -------------------------------------------------------------------------------- Problem List Details Patient Name: Theresa Rosales Date of Service: 12/01/2016 2:00 PM Medical Record Patient Account Number: [id number] Number: Treating RN: Clover Mealy, RN, BSN, Rita 09/08/1945 [phone] y.o. 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 E11.40 Type 2 diabetes mellitus with diabetic neuropathy, 11/11/2016 Yes unspecified Inactive Problems Resolved Problems ICD-10 Code Description Active Date Resolved Date L03.031 Cellulitis of right toe 11/11/2016 11/11/2016 Electronic Signature(s) Signed: 12/02/2016 3:50:17 PM By: Baltazar Najjar MD Entered By: Baltazar Najjar on 12/01/2016 14:19:39 Theresa Rosales ([id number]) -------------------------------------------------------------------------------- Progress Note Details Patient Name: Theresa Rosales Date of Service: 12/01/2016 2:00 PM Medical Record Patient Account Number: [id number] Number: Treating RN: Clover Mealy, RN, BSN, Rita 1945-10-05 [phone] y.o. Other Clinician: Date of Birth/Sex: Female) Treating Maxim Bedel Primary Care Provider: Ruffin Pyo Provider/Extender: G Referring  Provider: Ruffin Pyo Weeks in Treatment: 3 Subjective Chief Complaint Information obtained from Patient Right Great Toe Ulcer History of Present Illness (HPI) 11/10/16 patient presents for evaluation as referral from her primary care provider, Ruffin Pyo, MD who has been treating her right great toe wound. Unfortunately she has had issues with diabetic control and they are working on that. Nonetheless her hemoglobin A1c on last check which was April 2018 with 8.2. She did have an x-ray which was performed and evaluated by her primary on 09/07/16 which is commented in the note to show no bone involvement. I'll see this is good news. Mainly she has been performing wet to dry packing of the wound and on the Sep 07, 2016 encounter it was noted that her primary did pair down some of the callus surrounding the wound. However no excisional debridement of the wound  margins especially where it is immediately undermining and rolled is noted. She has been on two rounds of antibiotics clindamycin and Cipro fortunately there does not appear to be any sign of infection at this point. 11/17/16; patient admitted to the clinic last week with a diabetic foot ulcer on the plantar right great toe 11/24/16; plantar aspect of her right great toe dfu Wagner grade 2. Using Prisma 12/01/16; plantar aspect of her right great toe DF U Wagner grade 2 using Prisma without real change. ABI is 1. Previous x-ray showed no bone involvement Objective Constitutional Sitting or standing Blood Pressure is within target range for patient.. Pulse regular and within target range for patient.Marland Kitchen Respirations regular, non-labored and within target range.. Temperature is normal and within the target range for the patient.Marland Kitchen appears in no distress. Vitals Time Taken: 1:58 PM, Temperature: 98.3 F, Pulse: 86 bpm, Respiratory Rate: 18 breaths/min, Blood Pressure: 110/52 mmHg. [place], [name] ([id number]) Cardiovascular Pedal pulses palpable and strong bilaterally.. General Notes: Wound exam; right great toe plantar aspect. Again thick surrounding callus skin and subcutaneous tissue. There is no evidence of surrounding infection no drainage no erythema. Using a #3 curet I again take off his much of the callused skin subcutaneous tissue as I can. Hemostasis was silver nitrate. Integumentary (Hair, Skin) Wound #1 status is Open. Original cause of wound was Gradually Appeared. The wound is located on the Right Toe Great. The wound measures 1.1cm length x 0.5cm width x 0.3cm depth; 0.432cm^2 area and 0.13cm^3 volume. There is Fat Layer (Subcutaneous Tissue) Exposed exposed. There is no tunneling or undermining noted. There is a medium amount of serosanguineous drainage noted. The wound margin is distinct with the outline attached to the wound base. There is medium (34-66%) pink, pale granulation  within the wound bed. There is a small (1-33%) amount of necrotic tissue within the wound bed including Adherent Slough. The periwound skin appearance exhibited: Callus. The periwound skin appearance did not exhibit: Crepitus, Excoriation, Induration, Rash, Scarring, Dry/Scaly, Maceration, Atrophie Blanche, Cyanosis, Ecchymosis, Hemosiderin Staining, Mottled, Pallor, Rubor, Erythema. Periwound temperature was noted as No Abnormality. Assessment Active Problems ICD-10 E11.621 - Type 2 diabetes mellitus with foot ulcer L97.512 - Non-pressure chronic ulcer of other part of right foot with fat layer exposed E11.40 - Type 2 diabetes mellitus with diabetic neuropathy, unspecified Procedures Wound #1 Pre-procedure diagnosis of Wound #1 is a Diabetic Wound/Ulcer of the Lower Extremity located on the Right Toe Great .Severity of Tissue Pre Debridement is: Fat layer exposed. There was a Skin/Subcutaneous Tissue Debridement ([id number]) debridement with total area of 0.55 sq cm performed by Maxwell Caul, MD.  with the following instrument(s): Curette to remove Non-Viable tissue/material including Fibrin/Slough and Subcutaneous after achieving pain control using Lidocaine 4% Topical Solution. A time out was conducted at 14:14, prior to the start of the procedure. A Minimum amount of bleeding was controlled with Pressure. The procedure was tolerated well with a pain level of 0 throughout [place] ([id number]) and a pain level of 0 following the procedure. Post Debridement Measurements: 1.1cm length x 0.5cm width x 3cm depth; 1.296cm^3 volume. Character of Wound/Ulcer Post Debridement is stable. Severity of Tissue Post Debridement is: Fat layer exposed. Post procedure Diagnosis Wound #1: Same as Pre-Procedure Plan #1 we are going to continue with Prisma #2 we are going to put her in a total contact cast today however her right foot is her driving foot. Therefore now will now continue with  the [place]. #3 if this is unchanged next week, suggest casting at that point. Electronic Signature(s) Signed: 12/02/2016 3:50:17 PM By: Baltazar Najjar MD Entered By: Baltazar Najjar on 12/01/2016 14:24:52 Theresa Rosales ([id number]) -------------------------------------------------------------------------------- SuperBill Details Patient Name: Theresa Rosales Date of Service: 12/01/2016 Medical Record Patient Account Number: [id number] Number: Treating RN: Clover Mealy RN, BSN, Rita 10/09/1945 [phone] y.o. Other Clinician: Date of Birth/Sex: Female) Treating Aquarius Tremper Primary Care Provider: Ruffin Pyo Provider/Extender: G Referring Provider: Ruffin Pyo Weeks in Treatment: 3 Diagnosis Coding ICD-10 Codes Code Description E11.621 Type 2 diabetes mellitus with foot ulcer L97.512 Non-pressure chronic ulcer of other part of right foot with fat layer exposed E11.40 Type 2 diabetes mellitus with diabetic neuropathy, unspecified Facility Procedures CPT4 Code Description: [id number] 11042 - DEB SUBQ TISSUE 20 SQ CM/< ICD-10 Description Diagnosis L97.512 Non-pressure chronic ulcer of other part of right fo Modifier: ot with fat la Quantity: 1 yer exposed Physician Procedures CPT4 Code Description: [id number] 11042 - WC PHYS SUBQ TISS 20 SQ CM ICD-10 Description Diagnosis L97.512 Non-pressure chronic ulcer of other part of right fo Modifier: ot with fat lay Quantity: 1 er exposed Electronic Signature(s) Signed: 12/02/2016 3:50:17 PM By: Baltazar Najjar MD Entered By: Baltazar Najjar on 12/01/2016 14:25:16

## 2016-12-08 ENCOUNTER — Encounter: Payer: Medicare Other | Admitting: Physician Assistant

## 2016-12-08 DIAGNOSIS — E11621 Type 2 diabetes mellitus with foot ulcer: Secondary | ICD-10-CM | POA: Diagnosis not present

## 2016-12-10 NOTE — Progress Notes (Signed)
VALEN, MASCARO ([id number]) Visit Report for 12/08/2016 Chief Complaint Document Details Patient Name: Theresa Jensen, Theresa Jensen Date of Service: 12/08/2016 2:00 PM Medical Record Number: [id number] Patient Account Number: [id number] Date of Birth/Sex: 28-Nov-1945 (71 y.o. Female) Treating RN: Huel Coventry Primary Care Provider: Ruffin Pyo Other Clinician: Referring Provider: Ruffin Pyo Treating Provider/Extender: Lenda Kelp Weeks in Treatment: 4 Information Obtained from: Patient Chief Complaint Right Great Toe Ulcer Electronic Signature(s) Signed: 12/08/2016 6:07:57 PM By: Lenda Kelp PA-C Entered By: Lenda Kelp on 12/08/2016 14:37:04 Theresa Jensen ([id number]) -------------------------------------------------------------------------------- Debridement Details Patient Name: Theresa Jensen Date of Service: 12/08/2016 2:00 PM Medical Record Number: [id number] Patient Account Number: [id number] Date of Birth/Sex: Jun 02, 1945 (70 y.o. Female) Treating RN: Huel Coventry Primary Care Provider: Ruffin Pyo Other Clinician: Referring Provider: Ruffin Pyo Treating Provider/Extender: Linwood Dibbles, HOYT Weeks in Treatment: 4 Debridement Performed for Wound #1 Right Toe Great Assessment: Performed By: Physician STONE III, HOYT E., PA-C Debridement: Debridement Severity of Tissue Pre Fat layer exposed Debridement: Pre-procedure Verification/Time Out Yes - 14:18 Taken: Start Time: 14:19 Pain Control: Other : lidocaine 4% Level: Skin/Subcutaneous Tissue Total Area Debrided (L x 1 (cm) x 0.5 (cm) = 0.5 (cm) W): Tissue and other Viable, Non-Viable, Fibrin/Slough, Subcutaneous material debrided: Instrument: Curette Bleeding: Minimum Hemostasis Achieved: Pressure End Time: 14:22 Procedural Pain: 0 Post Procedural Pain: 0 Response to Treatment: Procedure was tolerated well Post Debridement Measurements of Total Wound Length: (cm) 1 Width: (cm) 0.5 Depth: (cm)  0.4 Volume: (cm) 0.157 Character of Wound/Ulcer Post Requires Further Debridement Debridement: Severity of Tissue Post Debridement: Fat layer exposed Post Procedure Diagnosis Same as Pre-procedure Electronic Signature(s) Signed: 12/08/2016 5:17:17 PM By: Elliot Gurney, BSN, RN, CWS, Kim RN, BSN Signed: 12/08/2016 6:07:57 PM By: Billey Gosling, [facility] ([id number]) Entered By: Elliot Gurney, BSN, RN, CWS, Kim on 12/08/2016 14:25:44 Theresa Jensen ([id number]) -------------------------------------------------------------------------------- HPI Details Patient Name: Theresa Jensen Date of Service: 12/08/2016 2:00 PM Medical Record Number: [id number] Patient Account Number: [id number] Date of Birth/Sex: January 20, 1946 (71 y.o. Female) Treating RN: Huel Coventry Primary Care Provider: Ruffin Pyo Other Clinician: Referring Provider: Ruffin Pyo Treating Provider/Extender: Linwood Dibbles, HOYT Weeks in Treatment: 4 History of Present Illness HPI Description: 11/10/16 patient presents for evaluation as referral from her primary care provider, Ruffin Pyo, MD who has been treating her right great toe wound. Unfortunately she has had issues with diabetic control and they are working on that. Nonetheless her hemoglobin A1c on last check which was April 2018 with 8.2. She did have an x-ray which was performed and evaluated by her primary on 09/07/16 which is commented in the note to show no bone involvement. I'll see this is good news. Mainly she has been performing wet to dry packing of the wound and on the Sep 07, 2016 encounter it was noted that her primary did pair down some of the callus surrounding the wound. However no excisional debridement of the wound margins especially where it is immediately undermining and rolled is noted. She has been on two rounds of antibiotics clindamycin and Cipro fortunately there does not appear to be any sign of infection at this point. 11/17/16; patient admitted to the  clinic last week with a diabetic foot ulcer on the plantar right great toe 11/24/16; plantar aspect of her right great toe dfu Wagner grade 2. Using Prisma 12/01/16; plantar aspect of her right great toe DF U Wagner grade 2 using Prisma without real change. ABI is 1. Previous x-ray showed no bone involvement 12/08/16 on evaluation  today patient's ulcer appears to be doing fairly well. She continues to have some callous buildup surrounding the ulcer region and she has some Slough noted on the surface of the wound but overall this is doing fairly well. No fevers, chills, nausea, or vomiting noted at this time. Electronic Signature(s) Signed: 12/08/2016 6:07:57 PM By: Lenda Kelp PA-C Entered By: Lenda Kelp on 12/08/2016 14:38:25 Theresa Jensen ([id number]) -------------------------------------------------------------------------------- Physical Exam Details Patient Name: Theresa Jensen Date of Service: 12/08/2016 2:00 PM Medical Record Number: [id number] Patient Account Number: [id number] Date of Birth/Sex: 06-10-1945 (70 y.o. Female) Treating RN: Huel Coventry Primary Care Provider: Ruffin Pyo Other Clinician: Referring Provider: Ruffin Pyo Treating Provider/Extender: Linwood Dibbles, HOYT Weeks in Treatment: 4 Constitutional Well-nourished and well-hydrated in no acute distress. Respiratory normal breathing without difficulty. Psychiatric this patient is able to make decisions and demonstrates good insight into disease process. Alert and Oriented x 3. pleasant and cooperative. Notes Patient has no erythema surrounding the wound bed the other that did require some pairing as well as debridement of the wound itself. Slough as well as subcutaneous tissue was removed she tolerated this with no pain. Electronic Signature(s) Signed: 12/08/2016 6:07:57 PM By: Lenda Kelp PA-C Entered By: Lenda Kelp on 12/08/2016 14:38:57 Theresa Jensen  ([id number]) -------------------------------------------------------------------------------- Physician Orders Details Patient Name: Theresa Jensen Date of Service: 12/08/2016 2:00 PM Medical Record Number: [id number] Patient Account Number: [id number] Date of Birth/Sex: 1946/02/22 (70 y.o. Female) Treating RN: Huel Coventry Primary Care Provider: Ruffin Pyo Other Clinician: Referring Provider: Ruffin Pyo Treating Provider/Extender: Linwood Dibbles, HOYT Weeks in Treatment: 4 Verbal / Phone Orders: No Diagnosis Coding ICD-10 Coding Code Description E11.621 Type 2 diabetes mellitus with foot ulcer L97.512 Non-pressure chronic ulcer of other part of right foot with fat layer exposed E11.40 Type 2 diabetes mellitus with diabetic neuropathy, unspecified Wound Cleansing Wound #1 Right Toe Great o Cleanse wound with mild soap and water o May Shower, gently pat wound dry prior to applying new dressing. o May shower with protection. Anesthetic Wound #1 Right Toe Great o Topical Lidocaine 4% cream applied to wound bed prior to debridement - in clinic Primary Wound Dressing Wound #1 Right Toe Great o Prisma Ag Secondary Dressing Wound #1 Right Toe Great o Gauze and Kerlix/Conform Dressing Change Frequency Wound #1 Right Toe Great o Change dressing every other day. Follow-up Appointments Wound #1 Right Toe Great o Return Appointment in 1 week. Off-Loading Wound #1 Right Toe [place], [place] ([id number]) o Open toe surgical shoe with peg assist. Additional Orders / Instructions Wound #1 Right Toe Great o Increase protein intake. o Activity as tolerated Electronic Signature(s) Signed: 12/08/2016 6:07:57 PM By: Lenda Kelp PA-C Entered By: Lenda Kelp on 12/08/2016 14:39:30 Theresa Jensen ([id number]) -------------------------------------------------------------------------------- Problem List Details Patient Name: Theresa Jensen Date of Service:  12/08/2016 2:00 PM Medical Record Number: [id number] Patient Account Number: [id number] Date of Birth/Sex: 04/28/1946 (70 y.o. Female) Treating RN: Huel Coventry Primary Care Provider: Ruffin Pyo Other Clinician: Referring Provider: Ruffin Pyo Treating Provider/Extender: Lenda Kelp Weeks in Treatment: 4 Active Problems ICD-10 Encounter Code Description Active Date Diagnosis E11.621 Type 2 diabetes mellitus with foot ulcer 11/11/2016 Yes L97.512 Non-pressure chronic ulcer of other part of right foot with 11/11/2016 Yes fat layer exposed E11.40 Type 2 diabetes mellitus with diabetic neuropathy, 11/11/2016 Yes unspecified Inactive Problems Resolved Problems ICD-10 Code Description Active Date Resolved Date L03.031 Cellulitis of right toe 11/11/2016 11/11/2016 Electronic Signature(s) Signed: 12/08/2016 6:07:57 PM By: Larina Bras  III, Hoyt PA-C Entered By: Lenda Kelp on 12/08/2016 13:59:26 Theresa Jensen ([id number]) -------------------------------------------------------------------------------- Progress Note Details Patient Name: Theresa Jensen, Theresa Jensen Date of Service: 12/08/2016 2:00 PM Medical Record Number: [id number] Patient Account Number: [id number] Date of Birth/Sex: December 11, 1945 (70 y.o. Female) Treating RN: Huel Coventry Primary Care Provider: Ruffin Pyo Other Clinician: Referring Provider: Ruffin Pyo Treating Provider/Extender: Linwood Dibbles, HOYT Weeks in Treatment: 4 Subjective Chief Complaint Information obtained from Patient Right Great Toe Ulcer History of Present Illness (HPI) 11/10/16 patient presents for evaluation as referral from her primary care provider, Ruffin Pyo, MD who has been treating her right great toe wound. Unfortunately she has had issues with diabetic control and they are working on that. Nonetheless her hemoglobin A1c on last check which was April 2018 with 8.2. She did have an x-ray which was performed and evaluated by her primary on 09/07/16 which  is commented in the note to show no bone involvement. I'll see this is good news. Mainly she has been performing wet to dry packing of the wound and on the Sep 07, 2016 encounter it was noted that her primary did pair down some of the callus surrounding the wound. However no excisional debridement of the wound margins especially where it is immediately undermining and rolled is noted. She has been on two rounds of antibiotics clindamycin and Cipro fortunately there does not appear to be any sign of infection at this point. 11/17/16; patient admitted to the clinic last week with a diabetic foot ulcer on the plantar right great toe 11/24/16; plantar aspect of her right great toe dfu Wagner grade 2. Using Prisma 12/01/16; plantar aspect of her right great toe DF U Wagner grade 2 using Prisma without real change. ABI is 1. Previous x-ray showed no bone involvement 12/08/16 on evaluation today patient's ulcer appears to be doing fairly well. She continues to have some callous buildup surrounding the ulcer region and she has some Slough noted on the surface of the wound but overall this is doing fairly well. No fevers, chills, nausea, or vomiting noted at this time. Objective Constitutional Well-nourished and well-hydrated in no acute distress. Vitals Time Taken: 1:47 PM, Height: 65 in, Weight: 187 lbs, BMI: 31.1, Temperature: 98.2 F, Pulse: 84 bpm, Respiratory Rate: 16 breaths/min, Blood Pressure: 101/53 mmHg. [place], [name] ([id number]) Respiratory normal breathing without difficulty. Psychiatric this patient is able to make decisions and demonstrates good insight into disease process. Alert and Oriented x 3. pleasant and cooperative. General Notes: Patient has no erythema surrounding the wound bed the other that did require some pairing as well as debridement of the wound itself. Slough as well as subcutaneous tissue was removed she tolerated this with no pain. Integumentary (Hair, Skin) Wound #1  status is Open. Original cause of wound was Gradually Appeared. The wound is located on the Right Toe Great. The wound measures 1cm length x 0.5cm width x 0.4cm depth; 0.393cm^2 area and 0.157cm^3 volume. There is Fat Layer (Subcutaneous Tissue) Exposed exposed. There is no tunneling or undermining noted. There is a medium amount of serosanguineous drainage noted. The wound margin is distinct with the outline attached to the wound base. There is medium (34-66%) pink, pale granulation within the wound bed. There is a small (1-33%) amount of necrotic tissue within the wound bed including Adherent Slough. The periwound skin appearance exhibited: Callus. The periwound skin appearance did not exhibit: Crepitus, Excoriation, Induration, Rash, Scarring, Dry/Scaly, Maceration, Atrophie Blanche, Cyanosis, Ecchymosis, Hemosiderin Staining, Mottled, Pallor, Rubor, Erythema. Periwound temperature  was noted as No Abnormality. Assessment Active Problems ICD-10 E11.621 - Type 2 diabetes mellitus with foot ulcer L97.512 - Non-pressure chronic ulcer of other part of right foot with fat layer exposed E11.40 - Type 2 diabetes mellitus with diabetic neuropathy, unspecified Procedures Wound #1 Pre-procedure diagnosis of Wound #1 is a Diabetic Wound/Ulcer of the Lower Extremity located on the Right Toe Great .Severity of Tissue Pre Debridement is: Fat layer exposed. There was a Skin/Subcutaneous Tissue Debridement ([id number]) debridement with total area of 0.5 sq cm performed by STONE III, HOYT E., PA-C. with the following instrument(s): Curette to remove Viable and Non-Viable tissue/material including Fibrin/Slough and Subcutaneous after achieving pain control using Other (lidocaine Theresa Jensen, Theresa Jensen ([id number]) 4%). A time out was conducted at 14:18, prior to the start of the procedure. A Minimum amount of bleeding was controlled with Pressure. The procedure was tolerated well with a pain level of 0 throughout  and a pain level of 0 following the procedure. Post Debridement Measurements: 1cm length x 0.5cm width x 0.4cm depth; 0.157cm^3 volume. Character of Wound/Ulcer Post Debridement requires further debridement. Severity of Tissue Post Debridement is: Fat layer exposed. Post procedure Diagnosis Wound #1: Same as Pre-Procedure Plan Wound Cleansing: Wound #1 Right Toe Great: Cleanse wound with mild soap and water May Shower, gently pat wound dry prior to applying new dressing. May shower with protection. Anesthetic: Wound #1 Right Toe Great: Topical Lidocaine 4% cream applied to wound bed prior to debridement - in clinic Primary Wound Dressing: Wound #1 Right Toe Great: Prisma Ag Secondary Dressing: Wound #1 Right Toe Great: Gauze and Kerlix/Conform Dressing Change Frequency: Wound #1 Right Toe Great: Change dressing every other day. Follow-up Appointments: Wound #1 Right Toe Great: Return Appointment in 1 week. Off-Loading: Wound #1 Right Toe Great: Open toe surgical shoe with peg assist. Additional Orders / Instructions: Wound #1 Right Toe Great: Increase protein intake. Activity as tolerated Okelley, Gracyn ([id number]) At this point I'm going to continue with the Current wound care measures for the next week. Hopefully this will continue to show signs of improvement in the interim. If anything worsens patient will contact our office for additional recommendations. I did discuss with her the possibility of a total contact cast. 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 Subsequently she also has to drive herself around and there really is not a good option for her to have others drive her. Being that this is her right foot I do not want her driving herself. For that reason we are gonna continue with her offloading shoe as we have been currently. Otherwise we will  see her in one week for reevaluation. Electronic Signature(s) Signed: 12/08/2016 6:07:57 PM By: Lenda KelpStone III, Hoyt PA-C Entered By: Lenda KelpStone III, Hoyt on 12/08/2016 14:40:21 Theresa RosalesSUTTON, Marynell ([id number]) -------------------------------------------------------------------------------- SuperBill Details Patient Name: Theresa RosalesSUTTON, Rachel Date of Service: 12/08/2016 Medical Record Number: [id number] Patient Account Number: [id number] Date of Birth/Sex: 01/04/1946 (70 y.o. Female) Treating RN: Huel CoventryWoody, Kim Primary Care Provider: Ruffin PyoBIN, REBECCA Other Clinician: Referring Provider: Ruffin PyoBIN, REBECCA Treating Provider/Extender: Linwood DibblesSTONE III, HOYT Weeks in Treatment: 4 Diagnosis Coding ICD-10 Codes Code Description E11.621 Type 2 diabetes mellitus with foot ulcer L97.512 Non-pressure chronic ulcer of other part of right foot with fat layer exposed E11.40 Type 2 diabetes mellitus with diabetic neuropathy, unspecified Facility Procedures CPT4 Code Description: [id number] 11042 - DEB SUBQ TISSUE  20 SQ CM/< ICD-10 Description Diagnosis L97.512 Non-pressure chronic ulcer of other part of right fo Modifier: ot with fat la Quantity: 1 yer exposed Physician Procedures CPT4 Code Description: [id number] 11042 - WC PHYS SUBQ TISS 20 SQ CM ICD-10 Description Diagnosis L97.512 Non-pressure chronic ulcer of other part of right fo Modifier: ot with fat lay Quantity: 1 er exposed Electronic Signature(s) Signed: 12/08/2016 6:07:57 PM By: Lenda Kelp PA-C Entered By: Lenda Kelp on 12/08/2016 14:40:45

## 2016-12-16 ENCOUNTER — Encounter: Payer: Medicare Other | Admitting: Physician Assistant

## 2016-12-16 DIAGNOSIS — E11621 Type 2 diabetes mellitus with foot ulcer: Secondary | ICD-10-CM | POA: Diagnosis not present

## 2016-12-17 ENCOUNTER — Ambulatory Visit: Payer: Medicare Other | Admitting: Physician Assistant

## 2016-12-17 NOTE — Progress Notes (Signed)
ANICE, WILSHIRE ([id number]) Visit Report for 12/16/2016 Chief Complaint Document Details Patient Name: Theresa Jensen, Theresa Jensen Date of Service: 12/16/2016 8:00 AM Medical Record Number: [id number] Patient Account Number: [id number] Date of Birth/Sex: 08/11/1945 (71 y.o. Female) Treating RN: Curtis Sites Primary Care Provider: Ruffin Pyo Other Clinician: Referring Provider: Ruffin Pyo Treating Provider/Extender: Lenda Kelp Weeks in Treatment: 5 Information Obtained from: Patient Chief Complaint Right Great Toe Ulcer Electronic Signature(s) Signed: 12/16/2016 12:41:32 PM By: Lenda Kelp PA-C Entered By: Lenda Kelp on 12/16/2016 08:21:37 Theresa Jensen ([id number]) -------------------------------------------------------------------------------- Debridement Details Patient Name: Theresa Jensen Date of Service: 12/16/2016 8:00 AM Medical Record Number: [id number] Patient Account Number: [id number] Date of Birth/Sex: 11-02-1945 (70 y.o. Female) Treating RN: Huel Coventry Primary Care Provider: Ruffin Pyo Other Clinician: Referring Provider: Ruffin Pyo Treating Provider/Extender: Linwood Dibbles, [place] Dani Weeks in Treatment: 5 Debridement Performed for Wound #1 Right Toe Great Assessment: Performed By: Physician STONE III, Jaquane Boughner E., PA-C Debridement: Debridement Severity of Tissue Pre Fat layer exposed Debridement: Pre-procedure Verification/Time Out Yes - 08:23 Taken: Start Time: 08:24 Pain Control: Other : lidocaine 4% Level: Skin/Subcutaneous Tissue Total Area Debrided (L x 0.7 (cm) x 0.3 (cm) = 0.21 (cm) W): Tissue and other Viable, Non-Viable, Callus, Subcutaneous material debrided: Instrument: Curette Bleeding: Minimum Hemostasis Achieved: Pressure End Time: 08:25 Procedural Pain: Insensate Post Procedural Pain: Insensate Response to Treatment: Procedure was tolerated well Post Debridement Measurements of Total Wound Length: (cm) 0.7 Width: (cm)  0.3 Depth: (cm) 0.3 Volume: (cm) 0.049 Character of Wound/Ulcer Post Improved Debridement: Severity of Tissue Post Debridement: Fat layer exposed Post Procedure Diagnosis Same as Pre-procedure Electronic Signature(s) Signed: 12/16/2016 12:41:32 PM By: Lenda Kelp PA-C Signed: 12/16/2016 1:20:34 PM By: Elliot Gurney BSN, RN, CWS, Kim RN, BSN Theresa Jensen, Theresa Jensen ([id number]) Entered By: Elliot Gurney, BSN, RN, CWS, Kim on 12/16/2016 08:25:19 Theresa Jensen ([id number]) -------------------------------------------------------------------------------- HPI Details Patient Name: Theresa Jensen Date of Service: 12/16/2016 8:00 AM Medical Record Number: [id number] Patient Account Number: [id number] Date of Birth/Sex: July 17, 1945 (71 y.o. Female) Treating RN: Curtis Sites Primary Care Provider: Ruffin Pyo Other Clinician: Referring Provider: Ruffin Pyo Treating Provider/Extender: Linwood Dibbles, Talayeh Bruinsma Weeks in Treatment: 5 History of Present Illness HPI Description: 11/10/16 patient presents for evaluation as referral from her primary care provider, Ruffin Pyo, MD who has been treating her right great toe wound. Unfortunately she has had issues with diabetic control and they are working on that. Nonetheless her hemoglobin A1c on last check which was April 2018 with 8.2. She did have an x-ray which was performed and evaluated by her primary on 09/07/16 which is commented in the note to show no bone involvement. I'll see this is good news. Mainly she has been performing wet to dry packing of the wound and on the Sep 07, 2016 encounter it was noted that her primary did pair down some of the callus surrounding the wound. However no excisional debridement of the wound margins especially where it is immediately undermining and rolled is noted. She has been on two rounds of antibiotics clindamycin and Cipro fortunately there does not appear to be any sign of infection at this point. 11/17/16; patient admitted  to the clinic last week with a diabetic foot ulcer on the plantar right great toe 11/24/16; plantar aspect of her right great toe dfu Wagner grade 2. Using Prisma 12/01/16; plantar aspect of her right great toe DF U Wagner grade 2 using Prisma without real change. ABI is 1. Previous x-ray showed no bone involvement 12/08/16 on evaluation today patient's  ulcer appears to be doing fairly well. She continues to have some callous buildup surrounding the ulcer region and she has some Slough noted on the surface of the wound but overall this is doing fairly well. No fevers, chills, nausea, or vomiting noted at this time. 12/16/16 on evaluation today patient appears to be doing fairly well in regard to her right great toe wound. She has been tolerating dressing changes without complication and continues to wear her offloading shoe. Fortunately she has no overall worsening symptoms deftly no evidence of infection. She experiences no pain. No fevers, chills, nausea, or vomiting noted at this time. Electronic Signature(s) Signed: 12/16/2016 12:41:32 PM By: Lenda Kelp PA-C Entered By: Lenda Kelp on 12/16/2016 08:28:48 Theresa Jensen ([id number]) -------------------------------------------------------------------------------- Physical Exam Details Patient Name: Theresa Jensen Date of Service: 12/16/2016 8:00 AM Medical Record Number: [id number] Patient Account Number: [id number] Date of Birth/Sex: [birth year] (70 y.o. Female) Treating RN: Curtis Sites Primary Care Provider: Ruffin Pyo Other Clinician: Referring Provider: Ruffin Pyo Treating Provider/Extender: STONE III, Jahmal Dunavant Weeks in Treatment: 5 Constitutional Well-nourished and well-hydrated in no acute distress. Respiratory normal breathing without difficulty. clear to auscultation bilaterally. Psychiatric this patient is able to make decisions and demonstrates good insight into disease process. Alert and Oriented x 3. pleasant and  cooperative. Notes Patient's wound bed shows some callous load of around which was sharply debride away today. I did also debride away slough on the surface of the wound down to an excellent granular bed at this point. Fortunately there does not appear to be any tunneling significantly although there is some proximal undermining at the 12 o'clock location in regard to the toe wound. Electronic Signature(s) Signed: 12/16/2016 12:41:32 PM By: Lenda Kelp PA-C Entered By: Lenda Kelp on 12/16/2016 08:29:35 Theresa Jensen ([id number]) -------------------------------------------------------------------------------- Physician Orders Details Patient Name: Theresa Jensen Date of Service: 12/16/2016 8:00 AM Medical Record Number: [id number] Patient Account Number: [id number] Date of Birth/Sex: [birth year] (70 y.o. Female) Treating RN: Huel Coventry Primary Care Provider: Ruffin Pyo Other Clinician: Referring Provider: Ruffin Pyo Treating Provider/Extender: Linwood Dibbles, Rasheema Truluck Weeks in Treatment: 5 Verbal / Phone Orders: No Diagnosis Coding ICD-10 Coding Code Description E11.621 Type 2 diabetes mellitus with foot ulcer L97.512 Non-pressure chronic ulcer of other part of right foot with fat layer exposed E11.40 Type 2 diabetes mellitus with diabetic neuropathy, unspecified Wound Cleansing Wound #1 Right Toe Great o Cleanse wound with mild soap and water o May Shower, gently pat wound dry prior to applying new dressing. o May shower with protection. Anesthetic Wound #1 Right Toe Great o Topical Lidocaine 4% cream applied to wound bed prior to debridement - in clinic Primary Wound Dressing Wound #1 Right Toe Great o Prisma Ag Secondary Dressing Wound #1 Right Toe Great o Gauze and Kerlix/Conform Dressing Change Frequency Wound #1 Right Toe Great o Change dressing every other day. Follow-up Appointments Wound #1 Right Toe Great o Return Appointment in 1  week. Off-Loading Wound #1 Right Toe [place], Theresa Jensen ([id number]) o Open toe surgical shoe with peg assist. Additional Orders / Instructions Wound #1 Right Toe Great o Increase protein intake. o Activity as tolerated Electronic Signature(s) Signed: 12/16/2016 12:41:32 PM By: Lenda Kelp PA-C Signed: 12/16/2016 1:20:34 PM By: Elliot Gurney, BSN, RN, CWS, Kim RN, BSN Entered By: Elliot Gurney, BSN, RN, CWS, Kim on 12/16/2016 08:26:46 Theresa Jensen ([id number]) -------------------------------------------------------------------------------- Problem List Details Patient Name: Theresa Jensen Date of Service: 12/16/2016 8:00 AM Medical Record Number: [id number] Patient Account Number: [id number] Date  of Birth/Sex: 1945/07/13 (71 y.o. Female) Treating RN: Curtis Sites Primary Care Provider: Ruffin Pyo Other Clinician: Referring Provider: Ruffin Pyo Treating Provider/Extender: Linwood Dibbles, Loghan Kurtzman Weeks in Treatment: 5 Active Problems ICD-10 Encounter Code Description Active Date Diagnosis E11.621 Type 2 diabetes mellitus with foot ulcer 11/11/2016 Yes L97.512 Non-pressure chronic ulcer of other part of right foot with 11/11/2016 Yes fat layer exposed E11.40 Type 2 diabetes mellitus with diabetic neuropathy, 11/11/2016 Yes unspecified Inactive Problems Resolved Problems ICD-10 Code Description Active Date Resolved Date L03.031 Cellulitis of right toe 11/11/2016 11/11/2016 Electronic Signature(s) Signed: 12/16/2016 12:41:32 PM By: Lenda Kelp PA-C Entered By: Lenda Kelp on 12/16/2016 08:21:21 Theresa Jensen ([id number]) -------------------------------------------------------------------------------- Progress Note Details Patient Name: Theresa Jensen Date of Service: 12/16/2016 8:00 AM Medical Record Number: [id number] Patient Account Number: [id number] Date of Birth/Sex: [birth year] (70 y.o. Female) Treating RN: Curtis Sites Primary Care Provider: Ruffin Pyo Other  Clinician: Referring Provider: Ruffin Pyo Treating Provider/Extender: Linwood Dibbles, Lexander Tremblay Weeks in Treatment: 5 Subjective Chief Complaint Information obtained from Patient Right Great Toe Ulcer History of Present Illness (HPI) 11/10/16 patient presents for evaluation as referral from her primary care provider, Ruffin Pyo, MD who has been treating her right great toe wound. Unfortunately she has had issues with diabetic control and they are working on that. Nonetheless her hemoglobin A1c on last check which was April 2018 with 8.2. She did have an x-ray which was performed and evaluated by her primary on 09/07/16 which is commented in the note to show no bone involvement. I'll see this is good news. Mainly she has been performing wet to dry packing of the wound and on the Sep 07, 2016 encounter it was noted that her primary did pair down some of the callus surrounding the wound. However no excisional debridement of the wound margins especially where it is immediately undermining and rolled is noted. She has been on two rounds of antibiotics clindamycin and Cipro fortunately there does not appear to be any sign of infection at this point. 11/17/16; patient admitted to the clinic last week with a diabetic foot ulcer on the plantar right great toe 11/24/16; plantar aspect of her right great toe dfu Wagner grade 2. Using Prisma 12/01/16; plantar aspect of her right great toe DF U Wagner grade 2 using Prisma without real change. ABI is 1. Previous x-ray showed no bone involvement 12/08/16 on evaluation today patient's ulcer appears to be doing fairly well. She continues to have some callous buildup surrounding the ulcer region and she has some Slough noted on the surface of the wound but overall this is doing fairly well. No fevers, chills, nausea, or vomiting noted at this time. 12/16/16 on evaluation today patient appears to be doing fairly well in regard to her right great toe wound. She has been  tolerating dressing changes without complication and continues to wear her offloading shoe. Fortunately she has no overall worsening symptoms deftly no evidence of infection. She experiences no pain. No fevers, chills, nausea, or vomiting noted at this time. Objective Constitutional Well-nourished and well-hydrated in no acute distress. Streeter, Theresa Jensen ([id number]) Vitals Time Taken: 8:11 AM, Height: 65 in, Source: Measured, Weight: 187 lbs, Source: Measured, BMI: 31.1, Temperature: 98.0 F, Pulse: 82 bpm, Respiratory Rate: 16 breaths/min, Blood Pressure: 119/59 mmHg. Respiratory normal breathing without difficulty. clear to auscultation bilaterally. Psychiatric this patient is able to make decisions and demonstrates good insight into disease process. Alert and Oriented x 3. pleasant and cooperative. General Notes: Patient's wound bed  shows some callous load of around which was sharply debride away today. I did also debride away slough on the surface of the wound down to an excellent granular bed at this point. Fortunately there does not appear to be any tunneling significantly although there is some proximal undermining at the 12 o'clock location in regard to the toe wound. Integumentary (Hair, Skin) Wound #1 status is Open. Original cause of wound was Gradually Appeared. The wound is located on the Right Toe Great. The wound measures 0.7cm length x 0.3cm width x 0.2cm depth; 0.165cm^2 area and 0.033cm^3 volume. There is Fat Layer (Subcutaneous Tissue) Exposed exposed. There is no tunneling or undermining noted. There is a medium amount of serosanguineous drainage noted. The wound margin is distinct with the outline attached to the wound base. There is medium (34-66%) pink, pale granulation within the wound bed. There is a small (1-33%) amount of necrotic tissue within the wound bed including Adherent Slough. The periwound skin appearance exhibited: Callus. The periwound skin appearance  did not exhibit: Crepitus, Excoriation, Induration, Rash, Scarring, Dry/Scaly, Maceration, Atrophie Blanche, Cyanosis, Ecchymosis, Hemosiderin Staining, Mottled, Pallor, Rubor, Erythema. Periwound temperature was noted as No Abnormality. Assessment Active Problems ICD-10 E11.621 - Type 2 diabetes mellitus with foot ulcer L97.512 - Non-pressure chronic ulcer of other part of right foot with fat layer exposed E11.40 - Type 2 diabetes mellitus with diabetic neuropathy, unspecified Procedures Theresa Jensen, Theresa Jensen ([id number]) Wound #1 Pre-procedure diagnosis of Wound #1 is a Diabetic Wound/Ulcer of the Lower Extremity located on the Right Toe Great .Severity of Tissue Pre Debridement is: Fat layer exposed. There was a Skin/Subcutaneous Tissue Debridement ([id number]) debridement with total area of 0.21 sq cm performed by STONE III, Sahory Nordling E., PA-C. with the following instrument(s): Curette to remove Viable and Non-Viable tissue/material including Callus and Subcutaneous after achieving pain control using Other (lidocaine 4%). A time out was conducted at 08:23, prior to the start of the procedure. A Minimum amount of bleeding was controlled with Pressure. The procedure was tolerated well with a pain level of Insensate throughout and a pain level of Insensate following the procedure. Post Debridement Measurements: 0.7cm length x 0.3cm width x 0.3cm depth; 0.049cm^3 volume. Character of Wound/Ulcer Post Debridement is improved. Severity of Tissue Post Debridement is: Fat layer exposed. Post procedure Diagnosis Wound #1: Same as Pre-Procedure Plan Wound Cleansing: Wound #1 Right Toe Great: Cleanse wound with mild soap and water May Shower, gently pat wound dry prior to applying new dressing. May shower with protection. Anesthetic: Wound #1 Right Toe Great: Topical Lidocaine 4% cream applied to wound bed prior to debridement - in clinic Primary Wound Dressing: Wound #1 Right Toe Great: Prisma  Ag Secondary Dressing: Wound #1 Right Toe Great: Gauze and Kerlix/Conform Dressing Change Frequency: Wound #1 Right Toe Great: Change dressing every other day. Follow-up Appointments: Wound #1 Right Toe Great: Return Appointment in 1 week. Off-Loading: Wound #1 Right Toe Great: Open toe surgical shoe with peg assist. Additional Orders / Instructions: Wound #1 Right Toe Great: Increase protein intake. Activity as tolerated Theresa Jensen, Theresa Jensen ([id number]) At this time I'm gonna recommend that we continue with the current wound care orders for the next week. We will see her for reevaluation at that point. If anything worsens in the interim she will contact the office for additional recommendations otherwise hopefully this will continue to show signs of improvement and closure. Electronic Signature(s) Signed: 12/16/2016 12:41:32 PM By: Lenda Kelp PA-C Entered By: Lenda Kelp on 12/16/2016 08:30:31  Theresa Jensen, Theresa Jensen ([id number]) -------------------------------------------------------------------------------- SuperBill Details Patient Name: Theresa Jensen, Theresa Jensen Date of Service: 12/16/2016 Medical Record Number: [id number] Patient Account Number: [id number] Date of Birth/Sex: Apr 27, 1946 (71 y.o. Female) Treating RN: Curtis Sites Primary Care Provider: Ruffin Pyo Other Clinician: Referring Provider: Ruffin Pyo Treating Provider/Extender: Linwood Dibbles, Damoni Erker Weeks in Treatment: 5 Diagnosis Coding ICD-10 Codes Code Description E11.621 Type 2 diabetes mellitus with foot ulcer L97.512 Non-pressure chronic ulcer of other part of right foot with fat layer exposed E11.40 Type 2 diabetes mellitus with diabetic neuropathy, unspecified Facility Procedures CPT4 Code Description: [id number] 11042 - DEB SUBQ TISSUE 20 SQ CM/< ICD-10 Description Diagnosis L97.512 Non-pressure chronic ulcer of other part of right fo Modifier: ot with fat la Quantity: 1 yer exposed Physician Procedures CPT4  Code Description: [id number] 11042 - WC PHYS SUBQ TISS 20 SQ CM ICD-10 Description Diagnosis L97.512 Non-pressure chronic ulcer of other part of right fo Modifier: ot with fat lay Quantity: 1 er exposed Electronic Signature(s) Signed: 12/16/2016 12:41:32 PM By: Lenda Kelp PA-C Entered By: Lenda Kelp on 12/16/2016 08:30:41

## 2016-12-17 NOTE — Progress Notes (Signed)
Theresa Jensen, Theresa Jensen ([id number]) Visit Report for 12/16/2016 Arrival Information Details Patient Name: Theresa, Jensen Date of Service: 12/16/2016 8:00 AM Medical Record Number: [id number] Patient Account Number: [id number] Date of Birth/Sex: 06-19-1945 (71 y.o. Female) Treating RN: Curtis Sites Primary Care Adrien Shankar: Ruffin Pyo Other Clinician: Referring Khyan Oats: Ruffin Pyo Treating Jemeka Wagler/Extender: Linwood Dibbles, HOYT Weeks in Treatment: 5 Visit Information History Since Last Visit Added or deleted any medications: No Patient Arrived: Ambulatory Any new allergies or adverse reactions: No Arrival Time: 08:06 Had a fall or experienced change in No Accompanied By: self activities of daily living that may affect Transfer Assistance: None risk of falls: Patient Identification Verified: Yes Signs or symptoms of abuse/neglect since last No Secondary Verification Process Yes visito Completed: Hospitalized since last visit: No Patient Requires Transmission- No Has Dressing in Place as Prescribed: Yes Based Precautions: Has Footwear/Offloading in Place as Yes Patient Has Alerts: Yes Prescribed: Patient Alerts: Patient on Blood Right: Wedge Thinner Shoe Hgb A1C 8.2 Pain Present Now: No (may) Electronic Signature(s) Signed: 12/16/2016 2:04:16 PM By: Curtis Sites Entered By: Curtis Sites on 12/16/2016 08:09:22 Theresa Jensen ([id number]) -------------------------------------------------------------------------------- Encounter Discharge Information Details Patient Name: Theresa Jensen Date of Service: 12/16/2016 8:00 AM Medical Record Number: [id number] Patient Account Number: [id number] Date of Birth/Sex: 1946-04-09 (71 y.o. Female) Treating RN: Curtis Sites Primary Care Tashyra Adduci: Ruffin Pyo Other Clinician: Referring Raeanne Deschler: Ruffin Pyo Treating Bradi Arbuthnot/Extender: Linwood Dibbles, HOYT Weeks in Treatment: 5 Encounter Discharge Information Items Discharge Pain  Level: 0 Discharge Condition: Stable Ambulatory Status: Ambulatory Discharge Destination: Home Transportation: Private Auto Accompanied By: self Schedule Follow-up Appointment: Yes Medication Reconciliation completed and provided to Patient/Care No [place] Capers: Provided on Clinical Summary of Care: 12/16/2016 Form Type Recipient Paper Patient DS Electronic Signature(s) Signed: 12/16/2016 2:40:18 PM By: Alejandro Mulling Previous Signature: 12/16/2016 8:34:45 AM Version By: Gwenlyn Perking Entered By: Alejandro Mulling on 12/16/2016 08:35:00 Theresa Jensen ([id number]) -------------------------------------------------------------------------------- Lower Extremity Assessment Details Patient Name: Theresa Jensen Date of Service: 12/16/2016 8:00 AM Medical Record Number: [id number] Patient Account Number: [id number] Date of Birth/Sex: [birth year] (71 y.o. Female) Treating RN: Curtis Sites Primary Care Nayla Dias: Ruffin Pyo Other Clinician: Referring Mizuki Hoel: Ruffin Pyo Treating Reighan Hipolito/Extender: Linwood Dibbles, HOYT Weeks in Treatment: 5 Vascular Assessment Pulses: Dorsalis Pedis Palpable: [Right:Yes] Posterior Tibial Extremity colors, hair growth, and conditions: Extremity Color: [Right:Normal] Hair Growth on Extremity: [Right:No] Temperature of Extremity: [Right:Warm] Capillary Refill: [Right:< 3 seconds] Electronic Signature(s) Signed: 12/16/2016 2:04:16 PM By: Curtis Sites Entered By: Curtis Sites on 12/16/2016 08:20:03 Theresa Jensen ([id number]) -------------------------------------------------------------------------------- Multi Wound Chart Details Patient Name: Theresa Jensen Date of Service: 12/16/2016 8:00 AM Medical Record Number: [id number] Patient Account Number: [id number] Date of Birth/Sex: Sep 06, 1945 (71 y.o. Female) Treating RN: Huel Coventry Primary Care Alfred Harrel: Ruffin Pyo Other Clinician: Referring Ireta Pullman: Ruffin Pyo Treating  Hawk Mones/Extender: Linwood Dibbles, HOYT Weeks in Treatment: 5 Vital Signs Height(in): 65 Pulse(bpm): 82 Weight(lbs): 187 Blood Pressure 119/59 (mmHg): Body Mass Index(BMI): 31 Temperature(F): 98.0 Respiratory Rate 16 (breaths/min): Photos: [1:No Photos] [N/A:N/A] Wound Location: [1:Right Toe Great] [N/A:N/A] Wounding Event: [1:Gradually Appeared] [N/A:N/A] Primary Etiology: [1:Diabetic Wound/Ulcer of N/A the Lower Extremity] Comorbid History: [1:Anemia, Hypertension, Type II Diabetes, Osteoarthritis, Neuropathy] [N/A:N/A] Date Acquired: [1:07/06/2016] [N/A:N/A] Weeks of Treatment: [1:5] [N/A:N/A] Wound Status: [1:Open] [N/A:N/A] Measurements L x W x D 0.7x0.3x0.2 [N/A:N/A] (cm) Area (cm) : [1:0.165] [N/A:N/A] Volume (cm) : [1:0.033] [N/A:N/A] % Reduction in Area: [1:59.60%] [N/A:N/A] % Reduction in Volume: 83.80% [N/A:N/A] Classification: [1:Grade 1] [N/A:N/A] Exudate Amount: [1:Medium] [N/A:N/A] Exudate Type: [1:Serosanguineous] [N/A:N/A] Exudate Color: [1:red, brown] [N/A:N/A]  Wound Margin: [1:Distinct, outline attached N/A] Granulation Amount: [1:Medium (34-66%)] [N/A:N/A] Granulation Quality: [1:Pink, Pale] [N/A:N/A] Necrotic Amount: [1:Small (1-33%)] [N/A:N/A] Exposed Structures: [1:Fat Layer (Subcutaneous N/A Tissue) Exposed: Yes Fascia: No Tendon: No Muscle: No] Joint: No Bone: No Epithelialization: Small (1-33%) N/A N/A Periwound Skin Texture: Callus: Yes N/A N/A Excoriation: No Induration: No Crepitus: No Rash: No Scarring: No Periwound Skin Maceration: No N/A N/A Moisture: Dry/Scaly: No Periwound Skin Color: Atrophie Blanche: No N/A N/A Cyanosis: No Ecchymosis: No Erythema: No Hemosiderin Staining: No Mottled: No Pallor: No Rubor: No Temperature: No Abnormality N/A N/A Tenderness on No N/A N/A Palpation: Wound Preparation: Ulcer Cleansing: N/A N/A Rinsed/Irrigated with Saline Topical Anesthetic Applied: Other: lidocaine 4% Treatment  Notes Electronic Signature(s) Signed: 12/16/2016 1:20:34 PM By: Elliot Gurney, BSN, RN, CWS, Kim RN, BSN Entered By: Elliot Gurney, BSN, RN, CWS, Kim on 12/16/2016 08:24:15 Theresa Jensen ([id number]) -------------------------------------------------------------------------------- Multi-Disciplinary Care Plan Details Patient Name: Theresa Jensen Date of Service: 12/16/2016 8:00 AM Medical Record Number: [id number] Patient Account Number: [id number] Date of Birth/Sex: 1945/12/13 (70 y.o. Female) Treating RN: Huel Coventry Primary Care Cole Eastridge: Ruffin Pyo Other Clinician: Referring Kateria Cutrona: Ruffin Pyo Treating Adabelle Griffiths/Extender: Linwood Dibbles, HOYT Weeks in Treatment: 5 Active Inactive ` Orientation to the Wound Care Program Nursing Diagnoses: Knowledge deficit related to the wound healing center program Goals: Patient/caregiver will verbalize understanding of the Wound Healing Center Program Date Initiated: 11/10/2016 Target Resolution Date: 03/13/2017 Goal Status: Active Interventions: Provide education on orientation to the wound center Notes: ` Peripheral Neuropathy Nursing Diagnoses: Knowledge deficit related to disease process and management of peripheral neurovascular dysfunction Potential alteration in peripheral tissue perfusion (select prior to confirmation of diagnosis) Goals: Patient/caregiver will verbalize understanding of disease process and disease management Date Initiated: 11/10/2016 Target Resolution Date: 03/13/2017 Goal Status: Active Interventions: Assess signs and symptoms of neuropathy upon admission and as needed Provide education on Management of Neuropathy and Related Ulcers Provide education on Management of Neuropathy upon discharge from the Wound Center Notes: ` Pressure [place], Theresa Jensen ([id number]) Nursing Diagnoses: Knowledge deficit related to management of pressures ulcers Potential for impaired tissue integrity related to pressure, friction, moisture,  and shear Goals: Patient will remain free from development of additional pressure ulcers Date Initiated: 11/10/2016 Target Resolution Date: 03/13/2017 Goal Status: Active Patient will remain free of pressure ulcers Date Initiated: 11/10/2016 Target Resolution Date: 03/13/2017 Goal Status: Active Patient/caregiver will verbalize risk factors for pressure ulcer development Date Initiated: 11/10/2016 Target Resolution Date: 03/13/2017 Goal Status: Active Patient/caregiver will verbalize understanding of pressure ulcer management Date Initiated: 11/10/2016 Target Resolution Date: 03/13/2017 Goal Status: Active Interventions: Assess: immobility, friction, shearing, incontinence upon admission and as needed Assess offloading mechanisms upon admission and as needed Assess potential for pressure ulcer upon admission and as needed Provide education on pressure ulcers Treatment Activities: Patient referred for home evaluation of offloading devices/mattresses : 11/10/2016 Patient referred for pressure reduction/relief devices : 11/10/2016 Notes: ` Wound/Skin Impairment Nursing Diagnoses: Impaired tissue integrity Knowledge deficit related to ulceration/compromised skin integrity Goals: Patient/caregiver will verbalize understanding of skin care regimen Date Initiated: 11/10/2016 Target Resolution Date: 03/13/2017 Goal Status: Active Ulcer/skin breakdown will have a volume reduction of 30% by week 4 Date Initiated: 11/10/2016 Target Resolution Date: 03/13/2017 Goal Status: Active Ulcer/skin breakdown will have a volume reduction of 50% by week 8 Theresa Jensen, Theresa Jensen ([id number]) Date Initiated: 11/10/2016 Target Resolution Date: 03/13/2017 Goal Status: Active Ulcer/skin breakdown will have a volume reduction of 80% by week 12 Date Initiated: 11/10/2016 Target Resolution Date: 03/13/2017 Goal Status: Active  Ulcer/skin breakdown will heal within 14 weeks Date Initiated: 11/10/2016 Target  Resolution Date: 03/13/2017 Goal Status: Active Interventions: Assess patient/caregiver ability to obtain necessary supplies Assess patient/caregiver ability to perform ulcer/skin care regimen upon admission and as needed Assess ulceration(s) every visit Provide education on ulcer and skin care Treatment Activities: Referred to DME Aralyn Nowak for dressing supplies : 11/10/2016 Skin care regimen initiated : 11/10/2016 Topical wound management initiated : 11/10/2016 Notes: Electronic Signature(s) Signed: 12/16/2016 1:20:34 PM By: Elliot Gurney, BSN, RN, CWS, Kim RN, BSN Entered By: Elliot Gurney, BSN, RN, CWS, Kim on 12/16/2016 08:24:08 Theresa Jensen ([id number]) -------------------------------------------------------------------------------- Pain Assessment Details Patient Name: Theresa Jensen Date of Service: 12/16/2016 8:00 AM Medical Record Number: [id number] Patient Account Number: [id number] Date of Birth/Sex: 1945-05-09 (70 y.o. Female) Treating RN: Curtis Sites Primary Care Gaylynn Seiple: Ruffin Pyo Other Clinician: Referring Eesha Schmaltz: Ruffin Pyo Treating Hanin Decook/Extender: Linwood Dibbles, HOYT Weeks in Treatment: 5 Active Problems Location of Pain Severity and Description of Pain Patient Has Paino No Site Locations Pain Management and Medication Current Pain Management: Notes Topical or injectable lidocaine is offered to patient for acute pain when surgical debridement is performed. If needed, Patient is instructed to use over the counter pain medication for the following 24-48 hours after debridement. Wound care MDs do not prescribed pain medications. Patient has chronic pain or uncontrolled pain. Patient has been instructed to make an appointment with their Primary Care Physician for pain management. Electronic Signature(s) Signed: 12/16/2016 2:04:16 PM By: Curtis Sites Entered By: Curtis Sites on 12/16/2016 08:11:02 Theresa Jensen  ([id number]) -------------------------------------------------------------------------------- Patient/Caregiver Education Details Patient Name: Theresa Jensen Date of Service: 12/16/2016 8:00 AM Medical Record Number: [id number] Patient Account Number: [id number] Date of Birth/Gender: 1945-06-04 (71 y.o. Female) Treating RN: Ashok Cordia, Debi Primary Care Physician: Ruffin Pyo Other Clinician: Referring Physician: Ruffin Pyo Treating Physician/Extender: Skeet Simmer in Treatment: 5 Education Assessment Education Provided To: Patient Education Topics Provided Wound/Skin Impairment: Handouts: Other: change dressing as ordered Methods: Demonstration, Explain/Verbal Responses: State content correctly Electronic Signature(s) Signed: 12/16/2016 2:40:18 PM By: Alejandro Mulling Entered By: Alejandro Mulling on 12/16/2016 08:35:45 Theresa Jensen, Theresa Jensen ([id number]) -------------------------------------------------------------------------------- Wound Assessment Details Patient Name: Theresa Jensen Date of Service: 12/16/2016 8:00 AM Medical Record Number: [id number] Patient Account Number: [id number] Date of Birth/Sex: 1945-05-06 (71 y.o. Female) Treating RN: Curtis Sites Primary Care Jacqualynn Parco: Ruffin Pyo Other Clinician: Referring Krupa Stege: Ruffin Pyo Treating Azlin Zilberman/Extender: Linwood Dibbles, HOYT Weeks in Treatment: 5 Wound Status Wound Number: 1 Primary Diabetic Wound/Ulcer of the Lower Etiology: Extremity Wound Location: Right Toe Great Wound Open Wounding Event: Gradually Appeared Status: Date Acquired: 07/06/2016 Comorbid Anemia, Hypertension, Type II Weeks Of Treatment: 5 History: Diabetes, Osteoarthritis, Neuropathy Clustered Wound: No Photos Photo Uploaded By: Curtis Sites on 12/16/2016 14:03:10 Wound Measurements Length: (cm) 0.7 Width: (cm) 0.3 Depth: (cm) 0.2 Area: (cm) 0.165 Volume: (cm) 0.033 % Reduction in Area: 59.6% % Reduction in  Volume: 83.8% Epithelialization: Small (1-33%) Tunneling: No Undermining: No Wound Description Classification: Grade 1 Foul Odor Af Wound Margin: Distinct, outline attached Slough/Fibri Exudate Amount: Medium Exudate Type: Serosanguineous Exudate Color: red, brown ter Cleansing: No no Yes Wound Bed Granulation Amount: Medium (34-66%) Exposed Structure Granulation Quality: Pink, Pale Fascia Exposed: No Necrotic Amount: Small (1-33%) Fat Layer (Subcutaneous Tissue) Exposed: Yes Necrotic Quality: Adherent Slough Tendon Exposed: No Hofstra, Massiel ([id number]) Muscle Exposed: No Joint Exposed: No Bone Exposed: No Periwound Skin Texture Texture Color No Abnormalities Noted: No No Abnormalities Noted: No Callus: Yes Atrophie Blanche: No Crepitus: No Cyanosis: No Excoriation: No Ecchymosis: No  Induration: No Erythema: No Rash: No Hemosiderin Staining: No Scarring: No Mottled: No Pallor: No Moisture Rubor: No No Abnormalities Noted: No Dry / Scaly: No Temperature / Pain Maceration: No Temperature: No Abnormality Wound Preparation Ulcer Cleansing: Rinsed/Irrigated with Saline Topical Anesthetic Applied: Other: lidocaine 4%, Treatment Notes Wound #1 (Right Toe Great) 1. Cleansed with: Clean wound with Normal Saline 2. Anesthetic Topical Lidocaine 4% cream to wound bed prior to debridement 4. Dressing Applied: Prisma Ag 5. Secondary Dressing Applied Dry Gauze Kerlix/Conform 7. Secured with Tape Notes front offload Electronic Signature(s) Signed: 12/16/2016 2:04:16 PM By: Curtis Sites Entered By: Curtis Sites on 12/16/2016 08:19:50 Theresa Jensen ([id number]) -------------------------------------------------------------------------------- Vitals Details Patient Name: Theresa Jensen Date of Service: 12/16/2016 8:00 AM Medical Record Number: [id number] Patient Account Number: [id number] Date of Birth/Sex: 04-26-1946 (71 y.o. Female) Treating RN: Curtis Sites Primary Care Jennilee Demarco: Ruffin Pyo Other Clinician: Referring Lilyonna Steidle: Ruffin Pyo Treating Maiah Sinning/Extender: Linwood Dibbles, HOYT Weeks in Treatment: 5 Vital Signs Time Taken: 08:11 Temperature (F): 98.0 Height (in): 65 Pulse (bpm): 82 Source: Measured Respiratory Rate (breaths/min): 16 Weight (lbs): 187 Blood Pressure (mmHg): 119/59 Source: Measured Reference Range: 80 - 120 mg / dl Body Mass Index (BMI): 31.1 Electronic Signature(s) Signed: 12/16/2016 2:04:16 PM By: Curtis Sites Entered By: Curtis Sites on 12/16/2016 08:12:08

## 2016-12-29 ENCOUNTER — Encounter: Payer: Medicare Other | Admitting: Internal Medicine

## 2016-12-29 DIAGNOSIS — E11621 Type 2 diabetes mellitus with foot ulcer: Secondary | ICD-10-CM | POA: Diagnosis not present

## 2016-12-31 NOTE — Progress Notes (Signed)
Theresa Jensen, Urvi ([id number]) Visit Report for 12/29/2016 Arrival Information Details Patient Name: Theresa Jensen, [place] Date of Service: 12/29/2016 2:30 PM Medical Record Patient Account Number: [id number] Number: Treating RN: Huel CoventryWoody, Kim [birth year] (71 y.o. Other Clinician: Date of Birth/Sex: Female) Treating ROBSON, MICHAEL Primary Care Lemond Griffee: Ruffin PyoBIN, REBECCA Neils [place]/Extender: G Referring Nakai Yard: Ruffin PyoBIN, REBECCA Weeks in Treatment: 7 Visit Information History Since Last Visit Added or deleted any medications: No Patient Arrived: Ambulatory Any new allergies or adverse reactions: No Arrival Time: 14:41 Had a fall or experienced change in No Accompanied By: self activities of daily living that may affect Transfer Assistance: None risk of falls: Patient Identification Verified: Yes Signs or symptoms of abuse/neglect since last No Secondary Verification Process Yes visito Completed: Hospitalized since last visit: No Patient Requires Transmission- No Has Dressing in Place as Prescribed: Yes Based Precautions: Pain Present Now: No Patient Has Alerts: Yes Patient Alerts: Patient on Blood Thinner Hgb A1C 8.2 (may) Electronic Signature(s) Signed: 12/29/2016 5:49:32 PM By: Elliot GurneyWoody, BSN, RN, CWS, Kim RN, BSN Entered By: Elliot GurneyWoody, BSN, RN, CWS, Kim on 12/29/2016 14:41:28 Theresa Jensen, Jackline ([id number]) -------------------------------------------------------------------------------- Encounter Discharge Information Details Patient Name: Theresa Jensen, Kaija Date of Service: 12/29/2016 2:30 PM Medical Record Patient Account Number: [id number] Number: Treating RN: Huel CoventryWoody, Kim [birth year] (71 y.o. Other Clinician: Date of Birth/Sex: Female) Treating ROBSON, MICHAEL Primary Care Mandeep Kiser: Ruffin PyoBIN, REBECCA Kana Reimann/Extender: G Referring Nacole Fluhr: Ruffin PyoBIN, REBECCA Weeks in Treatment: 7 Encounter Discharge Information Items Discharge Pain Level: 0 Discharge Condition:  Stable Ambulatory Status: Ambulatory Discharge Destination: Home Transportation: Private Auto Accompanied By: self Schedule Follow-up Appointment: Yes Medication Reconciliation completed and provided to Patient/Care Yes Avagail Whittlesey: Provided on Clinical Summary of Care: 12/29/2016 Form Type Recipient Paper Patient DS Electronic Signature(s) Signed: 12/30/2016 10:47:24 AM By: Gwenlyn PerkingMoore, Shelia Entered By: Gwenlyn PerkingMoore, Shelia on 12/29/2016 15:07:47 Theresa Jensen, Naviyah ([id number]) -------------------------------------------------------------------------------- Lower Extremity Assessment Details Patient Name: Theresa Jensen, July Date of Service: 12/29/2016 2:30 PM Medical Record Patient Account Number: [id number] Number: Treating RN: Huel CoventryWoody, Kim [birth year] (71 y.o. Other Clinician: Date of Birth/Sex: Female) Treating ROBSON, MICHAEL Primary Care Iverna Hammac: Ruffin PyoBIN, REBECCA Anginette Espejo/Extender: G Referring Kanija Remmel: Ruffin PyoBIN, REBECCA Weeks in Treatment: 7 Vascular Assessment Pulses: Dorsalis Pedis Palpable: [Right:Yes] Posterior Tibial Extremity colors, hair growth, and conditions: Extremity Color: [Right:Normal] Hair Growth on Extremity: [Right:No] Temperature of Extremity: [Right:Warm] Capillary Refill: [Right:< 3 seconds] Dependent Rubor: [Right:No] Blanched when Elevated: [Right:No] Lipodermatosclerosis: [Right:No] Toe Nail Assessment Left: Right: Thick: Yes Discolored: Yes Deformed: Yes Improper Length and Hygiene: Yes Electronic Signature(s) Signed: 12/29/2016 5:49:32 PM By: Elliot GurneyWoody, BSN, RN, CWS, Kim RN, BSN Entered By: Elliot GurneyWoody, BSN, RN, CWS, Kim on 12/29/2016 14:48:30 Theresa Jensen, [place] ([id number]) -------------------------------------------------------------------------------- Multi Wound Chart Details Patient Name: Theresa Jensen, Alithia Date of Service: 12/29/2016 2:30 PM Medical Record Patient Account Number: [id number] Number: Treating RN: Huel CoventryWoody, Kim [birth year] (71 y.o. Other  Clinician: Date of Birth/Sex: Female) Treating ROBSON, MICHAEL Primary Care Francis Doenges: Ruffin PyoBIN, REBECCA Lalia Loudon/Extender: G Referring Stephana Morell: Ruffin PyoBIN, REBECCA Weeks in Treatment: 7 Vital Signs Height(in): 65 Pulse(bpm): 101 Weight(lbs): 187 Blood Pressure 114/51 (mmHg): Body Mass Index(BMI): 31 Temperature(F): 98.3 Respiratory Rate 16 (breaths/min): Photos: [N/A:N/A] Wound Location: Right Toe Great N/A N/A Wounding Event: Gradually Appeared N/A N/A Primary Etiology: Diabetic Wound/Ulcer of N/A N/A the Lower Extremity Comorbid History: Anemia, Hypertension, N/A N/A Type II Diabetes, Osteoarthritis, Neuropathy Date Acquired: 07/06/2016 N/A N/A Weeks of Treatment: 7 N/A N/A Wound Status: Open N/A N/A Measurements L x W x D 0.9x0.5x0.4 N/A N/A (cm) Area (cm) : 0.353 N/A N/A Volume (cm) : 0.141 N/A  N/A % Reduction in Area: 13.50% N/A N/A % Reduction in Volume: 30.90% N/A N/A Starting Position 1 6 (o'clock): Ending Position 1 12 (o'clock): Maximum Distance 1 0.8 (cm): Undermining: Yes N/A N/A Stickler, [name] ([id number]) Classification: Grade 1 N/A N/A Exudate Amount: Medium N/A N/A Exudate Type: Serosanguineous N/A N/A Exudate Color: red, brown N/A N/A Wound Margin: Distinct, outline attached N/A N/A Granulation Amount: None Present (0%) N/A N/A Necrotic Amount: None Present (0%) N/A N/A Exposed Structures: Fat Layer (Subcutaneous N/A N/A Tissue) Exposed: Yes Fascia: No Tendon: No Muscle: No Joint: No Bone: No Epithelialization: None N/A N/A Periwound Skin Texture: Callus: Yes N/A N/A Excoriation: No Induration: No Crepitus: No Rash: No Scarring: No Periwound Skin Maceration: No N/A N/A Moisture: Dry/Scaly: No Periwound Skin Color: Atrophie Blanche: No N/A N/A Cyanosis: No Ecchymosis: No Erythema: No Hemosiderin Staining: No Mottled: No Pallor: No Rubor: No Temperature: No Abnormality N/A N/A Tenderness on No N/A N/A Palpation: Wound  Preparation: Ulcer Cleansing: N/A N/A Rinsed/Irrigated with Saline Topical Anesthetic Applied: Other: lidocaine 4% Treatment Notes Wound #1 (Right Toe Great) 1. Cleansed with: Clean wound with Normal Saline 2. Anesthetic Topical Lidocaine 4% cream to wound bed prior to debridement 4. Dressing Applied: ITZAYANNA, KASTER ([id number]) Prisma Ag 5. Secondary Dressing Applied Foam 7. Secured with Tape Notes front offload Electronic Signature(s) Signed: 12/29/2016 5:01:41 PM By: Baltazar Najjar MD Entered By: Baltazar Najjar on 12/29/2016 15:18:35 Theresa Rosales ([id number]) -------------------------------------------------------------------------------- Multi-Disciplinary Care Plan Details Patient Name: Theresa Rosales Date of Service: 12/29/2016 2:30 PM Medical Record Patient Account Number: [id number] Number: Treating RN: Huel Coventry 04-16-1946 (70 y.o. Other Clinician: Date of Birth/Sex: Female) Treating ROBSON, MICHAEL Primary Care Willia Genrich: Ruffin Pyo Ileen Kahre/Extender: G Referring Davarious Tumbleson: Ruffin Pyo Weeks in Treatment: 7 Active Inactive ` Orientation to the Wound Care Program Nursing Diagnoses: Knowledge deficit related to the wound healing center program Goals: Patient/caregiver will verbalize understanding of the Wound Healing Center Program Date Initiated: 11/10/2016 Target Resolution Date: 03/13/2017 Goal Status: Active Interventions: Provide education on orientation to the wound center Notes: ` Peripheral Neuropathy Nursing Diagnoses: Knowledge deficit related to disease process and management of peripheral neurovascular dysfunction Potential alteration in peripheral tissue perfusion (select prior to confirmation of diagnosis) Goals: Patient/caregiver will verbalize understanding of disease process and disease management Date Initiated: 11/10/2016 Target Resolution Date: 03/13/2017 Goal Status: Active Interventions: Assess signs and  symptoms of neuropathy upon admission and as needed Provide education on Management of Neuropathy and Related Ulcers Provide education on Management of Neuropathy upon discharge from the Wound Center Notes: JORGIA, MANTHEI ([id number]) Pressure Nursing Diagnoses: Knowledge deficit related to management of pressures ulcers Potential for impaired tissue integrity related to pressure, friction, moisture, and shear Goals: Patient will remain free from development of additional pressure ulcers Date Initiated: 11/10/2016 Target Resolution Date: 03/13/2017 Goal Status: Active Patient will remain free of pressure ulcers Date Initiated: 11/10/2016 Target Resolution Date: 03/13/2017 Goal Status: Active Patient/caregiver will verbalize risk factors for pressure ulcer development Date Initiated: 11/10/2016 Target Resolution Date: 03/13/2017 Goal Status: Active Patient/caregiver will verbalize understanding of pressure ulcer management Date Initiated: 11/10/2016 Target Resolution Date: 03/13/2017 Goal Status: Active Interventions: Assess: immobility, friction, shearing, incontinence upon admission and as needed Assess offloading mechanisms upon admission and as needed Assess potential for pressure ulcer upon admission and as needed Provide education on pressure ulcers Treatment Activities: Patient referred for home evaluation of offloading devices/mattresses : 11/10/2016 Patient referred for pressure reduction/relief devices : 11/10/2016 Notes: ` Wound/Skin Impairment Nursing Diagnoses: Impaired tissue  integrity Knowledge deficit related to ulceration/compromised skin integrity Goals: Patient/caregiver will verbalize understanding of skin care regimen Date Initiated: 11/10/2016 Target Resolution Date: 03/13/2017 Goal Status: Active Ulcer/skin breakdown will have a volume reduction of 30% by week 4 Date Initiated: 11/10/2016 Target Resolution Date: 03/13/2017 Goal Status: Active CICI, RODRIGES ([id number]) Ulcer/skin breakdown will have a volume reduction of 50% by week 8 Date Initiated: 11/10/2016 Target Resolution Date: 03/13/2017 Goal Status: Active Ulcer/skin breakdown will have a volume reduction of 80% by week 12 Date Initiated: 11/10/2016 Target Resolution Date: 03/13/2017 Goal Status: Active Ulcer/skin breakdown will heal within 14 weeks Date Initiated: 11/10/2016 Target Resolution Date: 03/13/2017 Goal Status: Active Interventions: Assess patient/caregiver ability to obtain necessary supplies Assess patient/caregiver ability to perform ulcer/skin care regimen upon admission and as needed Assess ulceration(s) every visit Provide education on ulcer and skin care Treatment Activities: Referred to DME Jarah Pember for dressing supplies : 11/10/2016 Skin care regimen initiated : 11/10/2016 Topical wound management initiated : 11/10/2016 Notes: Electronic Signature(s) Signed: 12/29/2016 5:49:32 PM By: Elliot Gurney, BSN, RN, CWS, Kim RN, BSN Entered By: Elliot Gurney, BSN, RN, CWS, Kim on 12/29/2016 14:48:36 Theresa Rosales ([id number]) -------------------------------------------------------------------------------- Pain Assessment Details Patient Name: Theresa Rosales Date of Service: 12/29/2016 2:30 PM Medical Record Patient Account Number: [id number] Number: Treating RN: Huel Coventry 1946/03/08 (70 y.o. Other Clinician: Date of Birth/Sex: Female) Treating ROBSON, MICHAEL Primary Care Chelcie Estorga: Ruffin Pyo Radwan Cowley/Extender: G Referring Charlotta Lapaglia: Ruffin Pyo Weeks in Treatment: 7 Active Problems Location of Pain Severity and Description of Pain Patient Has Paino No Site Locations With Dressing Change: No Pain Management and Medication Current Pain Management: Electronic Signature(s) Signed: 12/29/2016 5:49:32 PM By: Elliot Gurney, BSN, RN, CWS, Kim RN, BSN Entered By: Elliot Gurney, BSN, RN, CWS, Kim on 12/29/2016 14:41:37 Theresa Rosales  ([id number]) -------------------------------------------------------------------------------- Patient/Caregiver Education Details Patient Name: Theresa Rosales Date of Service: 12/29/2016 2:30 PM Medical Record Patient Account Number: [id number] Number: Treating RN: Huel Coventry [birth year] (70 y.o. Other Clinician: Date of Birth/Gender: Female) Treating ROBSON, MICHAEL Primary Care Physician/Extender: Milana Kidney, REBECCA Physician: Tania Ade in Treatment: 7 Referring Physician: Ruffin Pyo Education Assessment Education Provided To: Patient Education Topics Provided Wound/Skin Impairment: Handouts: Caring for Your Ulcer Methods: Demonstration Responses: State content correctly Electronic Signature(s) Signed: 12/29/2016 5:49:32 PM By: Elliot Gurney, BSN, RN, CWS, Kim RN, BSN Entered By: Elliot Gurney, BSN, RN, CWS, Kim on 12/29/2016 15:07:29 Theresa Rosales ([id number]) -------------------------------------------------------------------------------- Wound Assessment Details Patient Name: Theresa Rosales Date of Service: 12/29/2016 2:30 PM Medical Record Patient Account Number: [id number] Number: Treating RN: Huel Coventry 1946-03-29 (70 y.o. Other Clinician: Date of Birth/Sex: Female) Treating ROBSON, MICHAEL Primary Care Yoshi Vicencio: Ruffin Pyo Estel Tonelli/Extender: G Referring Dillan Lunden: Ruffin Pyo Weeks in Treatment: 7 Wound Status Wound Number: 1 Primary Diabetic Wound/Ulcer of the Lower Etiology: Extremity Wound Location: Right Toe Great Wound Open Wounding Event: Gradually Appeared Status: Date Acquired: 07/06/2016 Comorbid Anemia, Hypertension, Type II Weeks Of Treatment: 7 History: Diabetes, Osteoarthritis, Neuropathy Clustered Wound: No Photos Photo Uploaded By: Elliot Gurney, BSN, RN, CWS, Kim on 12/29/2016 14:51:31 Wound Measurements Length: (cm) 0.9 % Reduction in Width: (cm) 0.5 % Reduction in Depth: (cm) 0.4 Epithelializat Area: (cm) 0.353 Tunneling: Volume:  (cm) 0.141 Undermining: Starting Po Ending Posi Maximum Dis Area: 13.5% Volume: 30.9% ion: None No Yes sition (o'clock): 6 tion (o'clock): 12 tance: (cm) 0.8 Wound Description Classification: Grade 1 Foul Odor Afte Wound Margin: Distinct, outline attached Slough/Fibrino Exudate Amount: Medium Exudate Type: Serosanguineous Exudate Color: red, brown r Cleansing: No Yes Wound Bed Granulation Amount: None Present (  0%) Exposed Structure [place], Kash ([id number]) Necrotic Amount: None Present (0%) Fascia Exposed: No Fat Layer (Subcutaneous Tissue) Exposed: Yes Tendon Exposed: No Muscle Exposed: No Joint Exposed: No Bone Exposed: No Periwound Skin Texture Texture Color No Abnormalities Noted: No No Abnormalities Noted: No Callus: Yes Atrophie Blanche: No Crepitus: No Cyanosis: No Excoriation: No Ecchymosis: No Induration: No Erythema: No Rash: No Hemosiderin Staining: No Scarring: No Mottled: No Pallor: No Moisture Rubor: No No Abnormalities Noted: No Dry / Scaly: No Temperature / Pain Maceration: No Temperature: No Abnormality Wound Preparation Ulcer Cleansing: Rinsed/Irrigated with Saline Topical Anesthetic Applied: Other: lidocaine 4%, Treatment Notes Wound #1 (Right Toe Great) 1. Cleansed with: Clean wound with Normal Saline 2. Anesthetic Topical Lidocaine 4% cream to wound bed prior to debridement 4. Dressing Applied: Prisma Ag 5. Secondary Dressing Applied Foam 7. Secured with Tape Notes Counselling psychologist) Signed: 12/29/2016 5:49:32 PM By: Elliot Gurney, BSN, RN, CWS, Kim RN, BSN Entered By: Elliot Gurney, BSN, RN, CWS, Kim on 12/29/2016 14:47:43 Theresa Rosales ([id number]) -------------------------------------------------------------------------------- Vitals Details Patient Name: Theresa Rosales Date of Service: 12/29/2016 2:30 PM Medical Record Patient Account Number: [id number] Number: Treating RN: Huel Coventry [birth year] (70 y.o. Other Clinician: Date of Birth/Sex: Female) Treating ROBSON, MICHAEL Primary Care Luka Stohr: Ruffin Pyo Margaretmary Prisk/Extender: G Referring Carina Chaplin: Ruffin Pyo Weeks in Treatment: 7 Vital Signs Time Taken: 14:40 Temperature (F): 98.3 Height (in): 65 Pulse (bpm): 101 Weight (lbs): 187 Respiratory Rate (breaths/min): 16 Body Mass Index (BMI): 31.1 Blood Pressure (mmHg): 114/51 Reference Range: 80 - 120 mg / dl Electronic Signature(s) Signed: 12/29/2016 5:49:32 PM By: Elliot Gurney, BSN, RN, CWS, Kim RN, BSN Entered By: Elliot Gurney, BSN, RN, CWS, Kim on 12/29/2016 14:42:05

## 2017-01-02 NOTE — Progress Notes (Addendum)
Theresa, Jensen ([id number]) Visit Report for 12/29/2016 Debridement Details Patient Name: Theresa Jensen, Theresa Jensen Date of Service: 12/29/2016 2:30 PM Medical Record Patient Account Number: [id number] Number: Treating RN: Phillis Haggis Nov 14, 1945 (71 y.o. Other Clinician: Date of Birth/Sex: Female) Treating Andreas Sobolewski Primary Care Provider: Ruffin Pyo Provider/Extender: G Referring Provider: Ruffin Pyo Weeks in Treatment: 7 Debridement Performed for Wound #1 Right Toe Great Assessment: Performed By: Physician Maxwell Caul, MD Debridement: Debridement Severity of Tissue Pre Fat layer exposed Debridement: Pre-procedure Verification/Time Out Yes - 14:50 Taken: Start Time: 14:51 Pain Control: Other : lidocaine 4% Level: Skin/Subcutaneous Tissue Total Area Debrided (L x 0.9 (cm) x 0.5 (cm) = 0.45 (cm) W): Tissue and other Viable, Non-Viable, Callus, Fibrin/Slough, Subcutaneous material debrided: Instrument: Curette Bleeding: Minimum Hemostasis Achieved: Pressure End Time: 14:52 Procedural Pain: Insensate Post Procedural Pain: Insensate Response to Treatment: Procedure was tolerated well Post Debridement Measurements of Total Wound Length: (cm) 0.9 Width: (cm) 0.5 Depth: (cm) 0.4 Volume: (cm) 0.141 Character of Wound/Ulcer Post Stable Debridement: Severity of Tissue Post Debridement: Fat layer exposed Post Procedure Diagnosis Same as Pre-procedure MATAYAH, REYBURN ([id number]) Electronic Signature(s) Signed: 12/29/2016 5:01:41 PM By: Baltazar Najjar MD Signed: 12/31/2016 4:51:13 PM By: Alejandro Mulling Entered By: Baltazar Najjar on 12/29/2016 15:49:21 Theresa Jensen ([id number]) -------------------------------------------------------------------------------- HPI Details Patient Name: Theresa Jensen Date of Service: 12/29/2016 2:30 PM Medical Record Patient Account Number: [id number] Number: Treating RN: Phillis Haggis 1945/11/21 (71 y.o. Other Clinician: Date of Birth/Sex: Female) Treating Lealer Marsland Primary Care Provider: Ruffin Pyo Provider/Extender: G Referring Provider: Ruffin Pyo Weeks in Treatment: 7 History of Present Illness HPI Description: 11/10/16 patient presents for evaluation as referral from her primary care provider, Ruffin Pyo, MD who has been treating her right great toe wound. Unfortunately she has had issues with diabetic control and they are working on that. Nonetheless her hemoglobin A1c on last check which was April 2018 with 8.2. She did have an x-ray which was performed and evaluated by her primary on 09/07/16 which is commented in the note to show no bone involvement. I'll see this is good news. Mainly she has been performing wet to dry packing of the wound and on the Sep 07, 2016 encounter it was noted that her primary did pair down some of the callus surrounding the wound. However no excisional debridement of the wound margins especially where it is immediately undermining and rolled is noted. She has been on two rounds of antibiotics clindamycin and Cipro fortunately there does not appear to be any sign of infection at this point. 11/17/16; patient admitted to the clinic last week with a diabetic foot ulcer on the plantar right great toe 11/24/16; plantar aspect of her right great toe dfu Wagner grade 2. Using Prisma 12/01/16; plantar aspect of her right great toe DF U Wagner grade 2 using Prisma without real change. ABI is 1. Previous x-ray showed no bone involvement 12/08/16 on evaluation today patient's ulcer appears to be doing fairly well. She continues to have some callous buildup surrounding the ulcer region and she has some Slough noted on the surface of the wound but overall this is doing fairly well. No fevers, chills, nausea, or vomiting noted at this time. 12/16/16 on evaluation today patient appears to be doing fairly well in regard to her right great  toe wound. She has been tolerating dressing changes without complication and continues to wear her offloading shoe. Fortunately she has no overall worsening symptoms deftly no evidence of infection. She experiences  no pain. No fevers, chills, nausea, or vomiting noted at this time. 12/29/16; patient with a slitlike wound on the plantar aspect of her right great toe. She has been packing silver collagen into this wound however the area is 0.8 cm of depth medially. She is wearing a Darco forefoot offloading boot however it sounds as though she is very active. 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 She tells me that in 2 weeks she is going [place] Signed: 12/29/2016 5:01:41 PM By: Baltazar Najjar MD Entered By: Baltazar Najjar on 12/29/2016 15:20:21 Theresa Jensen ([id number]) -------------------------------------------------------------------------------- Physical Exam Details Patient Name: Theresa Jensen Date of Service: 12/29/2016 2:30 PM Medical Record Patient Account Number: [id number] Number: Treating RN: Phillis Haggis 1945-10-26 (71 y.o. Other Clinician: Date of Birth/Sex: Female) Treating Damari Hiltz Primary Care Provider: Ruffin Pyo Provider/Extender: G Referring Provider: Ruffin Pyo Weeks in Treatment: 7 Notes Wound exam; linear area with a slitlike wound on the plantar aspect of her right great toe. Using a #3 curet removes skin subcutaneous tissue opened the area which on presentation had 0.8 cm in depth. I managed to open this reasonably well. What I can see of the granulation post debridement looks healthy. Hemostasis was with silver nitrate Electronic Signature(s) Signed: 12/29/2016 5:01:41 PM By: Baltazar Najjar MD Entered By: Baltazar Najjar on 12/29/2016 15:22:49 Theresa Jensen ([id number]) -------------------------------------------------------------------------------- Physician Orders  Details Patient Name: Theresa Jensen Date of Service: 12/29/2016 2:30 PM Medical Record Patient Account Number: [id number] Number: Treating RN: Huel Coventry Jul 21, 1945 (71 y.o. Other Clinician: Date of Birth/Sex: Female) Treating Elizar Alpern Primary Care Provider: Ruffin Pyo Provider/Extender: G Referring Provider: Ruffin Pyo Weeks in Treatment: 7 Verbal / Phone Orders: No Diagnosis Coding Wound Cleansing Wound #1 Right Toe Great o Cleanse wound with mild soap and water o May Shower, gently pat wound dry prior to applying new dressing. o May shower with protection. Anesthetic Wound #1 Right Toe Great o Topical Lidocaine 4% cream applied to wound bed prior to debridement - in clinic Primary Wound Dressing Wound #1 Right Toe Great o Prisma Ag Secondary Dressing Wound #1 Right Toe Great o Gauze and Kerlix/Conform Dressing Change Frequency Wound #1 Right Toe Great o Change dressing every other day. Follow-up Appointments Wound #1 Right Toe Great o Return Appointment in 1 week. Off-Loading Wound #1 Right Toe Great o Open toe surgical shoe with peg assist. Additional Orders / Instructions Wound #1 Right Toe Great o Increase protein intake. ANGLINE, SCHWEIGERT ([id number]) o Activity as tolerated Electronic Signature(s) Signed: 12/29/2016 5:01:41 PM By: Baltazar Najjar MD Signed: 12/29/2016 5:49:32 PM By: Elliot Gurney, BSN, RN, CWS, Kim RN, BSN Entered By: Elliot Gurney, BSN, RN, CWS, Kim on 12/29/2016 14:58:48 Theresa Jensen ([id number]) -------------------------------------------------------------------------------- Problem List Details Patient Name: Theresa Jensen Date of Service: 12/29/2016 2:30 PM Medical Record Patient Account Number: [id number] Number: Treating RN: Phillis Haggis 1945/10/30 (70 y.o. 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 E11.40 Type 2 diabetes mellitus with diabetic neuropathy, 11/11/2016 Yes unspecified Inactive Problems Resolved Problems ICD-10 Code Description Active Date Resolved Date L03.031 Cellulitis of right toe 11/11/2016 11/11/2016 Electronic Signature(s) Signed: 12/29/2016 5:01:41 PM By: Baltazar Najjar MD Entered  By: Baltazar Najjar on 12/29/2016 15:18:25 Theresa Jensen ([id number]) -------------------------------------------------------------------------------- Progress Note Details Patient Name: Theresa Jensen Date of Service: 12/29/2016 2:30 PM Medical Record Patient Account Number: [id number] Number: Treating RN: Phillis Haggis 1945/08/23 (70 y.o. Other Clinician: Date of Birth/Sex: Female) Treating Makaio Mach Primary Care Provider: Ruffin Pyo Provider/Extender: G Referring Provider: Ruffin Pyo Weeks in Treatment: 7 Subjective History of Present Illness (HPI) 11/10/16 patient presents for evaluation as referral from her primary care provider, Ruffin Pyo, MD who has been treating her right great toe wound. Unfortunately she has had issues with diabetic control and they are working on that. Nonetheless her hemoglobin A1c on last check which was April 2018 with 8.2. She did have an x-ray which was performed and evaluated by her primary on 09/07/16 which is commented in the note to show no bone involvement. I'll see this is good news. Mainly she has been performing wet to dry packing of the wound and on the Sep 07, 2016 encounter it was noted that her primary did pair down some of the callus surrounding the wound. However no excisional debridement of the wound margins  especially where it is immediately undermining and rolled is noted. She has been on two rounds of antibiotics clindamycin and Cipro fortunately there does not appear to be any sign of infection at this point. 11/17/16; patient admitted to the clinic last week with a diabetic foot ulcer on the plantar right great toe 11/24/16; plantar aspect of her right great toe dfu Wagner grade 2. Using Prisma 12/01/16; plantar aspect of her right great toe DF U Wagner grade 2 using Prisma without real change. ABI is 1. Previous x-ray showed no bone involvement 12/08/16 on evaluation today patient's ulcer appears to be doing fairly well. She continues to have some callous buildup surrounding the ulcer region and she has some Slough noted on the surface of the wound but overall this is doing fairly well. No fevers, chills, nausea, or vomiting noted at this time. 12/16/16 on evaluation today patient appears to be doing fairly well in regard to her right great toe wound. She has been tolerating dressing changes without complication and continues to wear her offloading shoe. Fortunately she has no overall worsening symptoms deftly no evidence of infection. She experiences no pain. No fevers, chills, nausea, or vomiting noted at this time. 12/29/16; patient with a slitlike wound on the plantar aspect of her right great toe. She has been packing silver collagen into this wound however the area is 0.8 cm of depth medially. She is wearing a Dar[employer] forefoot offloading boot however it sounds as though she is very active. 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 She tells me that in 2 weeks she is going to the "[place]" Objective Constitutional Vitals Time Taken: 2:40 PM, Height: 65 in, Weight: 187 lbs, BMI: 31.1, Temperature: 98.3 F, Pulse: 101 Backer, Jaimie ([id number]) bpm, Respiratory Rate: 16 breaths/min, Blood Pressure: 114/51 mmHg. Integumentary (Hair, Skin) Wound #1 status is Open. Original cause of  wound was Gradually Appeared. The wound is located on the Right Toe Great. The wound measures 0.9cm length x 0.5cm width x 0.4cm depth; 0.353cm^2 area and 0.141cm^3 volume. There is Fat Layer (Subcutaneous Tissue) Exposed exposed. There is no tunneling noted, however, there is undermining starting at 6:00 and ending at 12:00 with a maximum distance of 0.8cm. There is a medium amount of serosanguineous drainage noted. The wound margin is distinct with the outline attached to the wound  base. There is no granulation within the wound bed. There is no necrotic tissue within the wound bed. The periwound skin appearance exhibited: Callus. The periwound skin appearance did not exhibit: Crepitus, Excoriation, Induration, Rash, Scarring, Dry/Scaly, Maceration, Atrophie Blanche, Cyanosis, Ecchymosis, Hemosiderin Staining, Mottled, Pallor, Rubor, Erythema. Periwound temperature was noted as No Abnormality. Assessment Active Problems ICD-10 E11.621 - Type 2 diabetes mellitus with foot ulcer L97.512 - Non-pressure chronic ulcer of other part of right foot with fat layer exposed E11.40 - Type 2 diabetes mellitus with diabetic neuropathy, unspecified Procedures Wound #1 Pre-procedure diagnosis of Wound #1 is a Diabetic Wound/Ulcer of the Lower Extremity located on the Right Toe Great .Severity of Tissue Pre Debridement is: Fat layer exposed. There was a Skin/Subcutaneous Tissue Debridement ([id number]) debridement with total area of 0.45 sq cm performed by Maxwell CaulOBSON, Joni Norrod G, MD. with the following instrument(s): Curette to remove Viable and Non-Viable tissue/material including Fibrin/Slough, Callus, and Subcutaneous after achieving pain control using Other (lidocaine 4%). A time out was conducted at 14:50, prior to the start of the procedure. A Minimum amount of bleeding was controlled with Pressure. The procedure was tolerated well with a pain level of Insensate throughout and a pain level of Insensate  following the procedure. Post Debridement Measurements: 0.9cm length x 0.5cm width x 0.4cm depth; 0.141cm^3 volume. Character of Wound/Ulcer Post Debridement is stable. Severity of Tissue Post Debridement is: Fat layer exposed. Post procedure Diagnosis Wound #1: Same as Pre-Procedure Joanne GavelSUTTON, Gavin PoundEBORAH ([id number]) Plan Wound Cleansing: Wound #1 Right Toe Great: Cleanse wound with mild soap and water May Shower, gently pat wound dry prior to applying new dressing. May shower with protection. Anesthetic: Wound #1 Right Toe Great: Topical Lidocaine 4% cream applied to wound bed prior to debridement - in clinic Primary Wound Dressing: Wound #1 Right Toe Great: Prisma Ag Secondary Dressing: Wound #1 Right Toe Great: Gauze and Kerlix/Conform Dressing Change Frequency: Wound #1 Right Toe Great: Change dressing every other day. Follow-up Appointments: Wound #1 Right Toe Great: Return Appointment in 1 week. Off-Loading: Wound #1 Right Toe Great: Open toe surgical shoe with peg assist. Additional Orders / Instructions: Wound #1 Right Toe Great: Increase protein intake. Activity as tolerated #1 I think we can continue with the silver alginate as the primary dressing #2 I think this is largely an offloading issue, she is far too active and as she points out needs to be able to drive to consider a total contact cast. #3 in 2 weeks she is going [place] the [place], this will no doubt put a lot of offloading on this area too. #4 there is no obvious infection. I did not think culture was necessary Theresa RosalesSUTTON, Nickey ([id number]) Electronic Signature(s) Signed: 01/10/2017 12:53:54 PM By: Baltazar Najjarobson, Amere Bricco MD Signed: 01/11/2017 8:11:33 AM By: Elliot GurneyWoody, BSN, RN, CWS, Kim RN, BSN Previous Signature: 12/29/2016 5:01:41 PM Version By: Baltazar Najjarobson, Myking Sar MD Entered By: Elliot GurneyWoody, BSN, RN, CWS, Kim on 01/06/2017 11:26:27 Theresa RosalesSUTTON, Lucindia  ([id number]) -------------------------------------------------------------------------------- SuperBill Details Patient Name: Theresa RosalesSUTTON, Asuncion Date of Service: 12/29/2016 Medical Record Patient Account Number: [id number] Number: Treating RN: Phillis Haggisinkerton, Debi 1946-01-05 (70 y.o. Other Clinician: Date of Birth/Sex: Female) Treating Sabel Hornbeck Primary Care Provider: Ruffin PyoBIN, REBECCA Provider/Extender: G Referring Provider: Ruffin PyoBIN, REBECCA Weeks in Treatment: 7 Diagnosis Coding ICD-10 Codes Code Description E11.621 Type 2 diabetes mellitus with foot ulcer L97.512 Non-pressure chronic ulcer of other part of right foot with fat layer exposed E11.40 Type 2 diabetes mellitus with diabetic neuropathy, unspecified Facility Procedures CPT4 Code  Description: [id number] 11042 - DEB SUBQ TISSUE 20 SQ CM/< ICD-10 Description Diagnosis E11.621 Type 2 diabetes mellitus with foot ulcer L97.512 Non-pressure chronic ulcer of other part of right fo Modifier: ot with fat la Quantity: 1 yer exposed Physician Procedures CPT4 Code Description: [id number] 11042 - WC PHYS SUBQ TISS 20 SQ CM ICD-10 Description Diagnosis E11.621 Type 2 diabetes mellitus with foot ulcer L97.512 Non-pressure chronic ulcer of other part of right fo Modifier: ot with fat lay Quantity: 1 er exposed Electronic Signature(s) Signed: 12/29/2016 5:01:41 PM By: Baltazar Najjar MD Entered By: Baltazar Najjar on 12/29/2016 15:51:49

## 2017-01-05 ENCOUNTER — Encounter: Payer: Medicare Other | Attending: Internal Medicine | Admitting: Internal Medicine

## 2017-01-05 DIAGNOSIS — Z87891 Personal history of nicotine dependence: Secondary | ICD-10-CM | POA: Diagnosis not present

## 2017-01-05 DIAGNOSIS — M1990 Unspecified osteoarthritis, unspecified site: Secondary | ICD-10-CM | POA: Diagnosis not present

## 2017-01-05 DIAGNOSIS — E1140 Type 2 diabetes mellitus with diabetic neuropathy, unspecified: Secondary | ICD-10-CM | POA: Diagnosis not present

## 2017-01-05 DIAGNOSIS — Z7984 Long term (current) use of oral hypoglycemic drugs: Secondary | ICD-10-CM | POA: Diagnosis not present

## 2017-01-05 DIAGNOSIS — I10 Essential (primary) hypertension: Secondary | ICD-10-CM | POA: Insufficient documentation

## 2017-01-05 DIAGNOSIS — E11621 Type 2 diabetes mellitus with foot ulcer: Secondary | ICD-10-CM | POA: Diagnosis not present

## 2017-01-05 DIAGNOSIS — L97512 Non-pressure chronic ulcer of other part of right foot with fat layer exposed: Secondary | ICD-10-CM | POA: Insufficient documentation

## 2017-01-07 NOTE — Progress Notes (Signed)
LATESIA, NORRINGTON ([id number]) Visit Report for 01/05/2017 Debridement Details Patient Name: Theresa Jensen, Theresa Jensen Date of Service: 01/05/2017 10:15 AM Medical Record Patient Account Number: [id number] Number: Treating RN: Curtis Sites [birth year] (71 y.o. Other Clinician: Date of Birth/Sex: Female) Treating Donnisha Besecker Primary Care Provider: Ruffin Pyo Provider/Extender: G Referring Provider: Ruffin Pyo Weeks in Treatment: 8 Debridement Performed for Wound #1 Right Toe Great Assessment: Performed By: Physician Maxwell Caul, MD Debridement: Debridement Severity of Tissue Pre Fat layer exposed Debridement: Pre-procedure Verification/Time Out Yes - 10:24 Taken: Start Time: 10:25 Pain Control: Other : lidocaine 4% Level: Skin/Subcutaneous Tissue Total Area Debrided (L x 1 (cm) x 0.6 (cm) = 0.6 (cm) W): Tissue and other Viable, Non-Viable, Callus, Subcutaneous material debrided: Instrument: Curette Bleeding: Minimum Hemostasis Achieved: Pressure End Time: 10:26 Procedural Pain: 0 Post Procedural Pain: 0 Response to Treatment: Procedure was tolerated well Post Debridement Measurements of Total Wound Length: (cm) 1.2 Width: (cm) 0.8 Depth: (cm) 0.4 Volume: (cm) 0.302 Character of Wound/Ulcer Post Stable Debridement: Severity of Tissue Post Debridement: Fat layer exposed Post Procedure Diagnosis Same as Pre-procedure AYLINE, DINGUS ([id number]) Electronic Signature(s) Signed: 01/05/2017 5:15:21 PM By: Baltazar Najjar MD Signed: 01/05/2017 5:49:39 PM By: Curtis Sites Entered By: Baltazar Najjar on 01/05/2017 10:28:25 Theresa Jensen ([id number]) -------------------------------------------------------------------------------- HPI Details Patient Name: Theresa Jensen Date of Service: 01/05/2017 10:15 AM Medical Record Patient Account Number: [id number] Number: Treating RN: Curtis Sites 08/20/1945 (71 y.o. Other Clinician: Date of  Birth/Sex: Female) Treating Tanika Bracco Primary Care Provider: Ruffin Pyo Provider/Extender: G Referring Provider: Ruffin Pyo Weeks in Treatment: 8 History of Present Illness HPI Description: 11/10/16 patient presents for evaluation as referral from her primary care provider, Ruffin Pyo, MD who has been treating her right great toe wound. Unfortunately she has had issues with diabetic control and they are working on that. Nonetheless her hemoglobin A1c on last check which was April 2018 with 8.2. She did have an x-ray which was performed and evaluated by her primary on 09/07/16 which is commented in the note to show no bone involvement. I'll see this is good news. Mainly she has been performing wet to dry packing of the wound and on the Sep 07, 2016 encounter it was noted that her primary did pair down some of the callus surrounding the wound. However no excisional debridement of the wound margins especially where it is immediately undermining and rolled is noted. She has been on two rounds of antibiotics clindamycin and Cipro fortunately there does not appear to be any sign of infection at this point. 11/17/16; patient admitted to the clinic last week with a diabetic foot ulcer on the plantar right great toe 11/24/16; plantar aspect of her right great toe dfu Wagner grade 2. Using Prisma 12/01/16; plantar aspect of her right great toe DF U Wagner grade 2 using Prisma without real change. ABI is 1. Previous x-ray showed no bone involvement 12/08/16 on evaluation today patient's ulcer appears to be doing fairly well. She continues to have some callous buildup surrounding the ulcer region and she has some Slough noted on the surface of the wound but overall this is doing fairly well. No fevers, chills, nausea, or vomiting noted at this time. 12/16/16 on evaluation today patient appears to be doing fairly well in regard to her right great toe wound. She has been tolerating dressing changes  without complication and continues to wear her offloading shoe. Fortunately she has no overall worsening symptoms deftly no evidence of infection. She experiences no  pain. No fevers, chills, nausea, or vomiting noted at this time. 12/29/16; patient with a slitlike wound on the plantar aspect of her right great toe. She has been packing silver collagen into this wound however the area is 0.8 cm of depth medially. She is wearing a [employer] forefoot offloading boot however it sounds as though she is very active. 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 She has been using silver collagen. [place] [employer] forefoot off loader in spite of this her wound really hasn't looked too bad. She'll be here next week and then absent the next week for her equestrian event. Electronic Signature(s) Signed: 01/05/2017 5:15:21 PM By: Baltazar Najjar MD Entered By: Baltazar Najjar on 01/05/2017 10:29:46 Theresa Jensen ([id number]) -------------------------------------------------------------------------------- Physical Exam Details Patient Name: Theresa Jensen Date of Service: 01/05/2017 10:15 AM Medical Record Patient Account Number: [id number] Number: Treating RN: Curtis Sites 24-Oct-1945 (71 y.o. Other Clinician: Date of Birth/Sex: Female) Treating Duana Benedict Primary Care Provider: Ruffin Pyo Provider/Extender: G Referring Provider: Ruffin Pyo Weeks in Treatment: 8 Constitutional Sitting or standing Blood Pressure is within target range for patient.. Pulse regular and within target range for patient.Marland Kitchen Respirations regular, non-labored and within target range.. Temperature is normal and within the target range for the patient.Marland Kitchen appears in no distress. Cardiovascular Pedal pulses palpable on the right wound  exam. Notes Wound exam; linear wound area with a slitlike deformity on the plantar aspect of her right great toe. Using a #3 curet I removed nonviable surface callus and then necrotic material from the base of the wound. Hemostasis with direct pressure there is still some depth to this although the base of the wound looks stable. Electronic Signature(s) Signed: 01/05/2017 5:15:21 PM By: Baltazar Najjar MD Entered By: Baltazar Najjar on 01/05/2017 10:33:04 Theresa Jensen ([id number]) -------------------------------------------------------------------------------- Physician Orders Details Patient Name: Theresa Jensen Date of Service: 01/05/2017 10:15 AM Medical Record Patient Account Number: [id number] Number: Treating RN: Huel Coventry December 12, 1945 (70 y.o. Other Clinician: Date of Birth/Sex: Female) Treating Bayani Renteria Primary Care Provider: Ruffin Pyo Provider/Extender: G Referring Provider: Ruffin Pyo Weeks in Treatment: 8 Verbal / Phone Orders: No Diagnosis Coding ICD-10 Coding Code Description E11.621 Type 2 diabetes mellitus with foot ulcer L97.512 Non-pressure chronic ulcer of other part of right foot with fat layer exposed E11.40 Type 2 diabetes mellitus with diabetic neuropathy, unspecified Wound Cleansing Wound #1 Right Toe Great o Cleanse wound with mild soap and water o May Shower, gently pat wound dry prior to applying new dressing. o May shower with protection. Anesthetic Wound #1 Right Toe Great o Topical Lidocaine 4% cream applied to wound bed prior to debridement - in clinic Primary Wound Dressing Wound #1 Right Toe Great o Other: - endoform Secondary Dressing Wound #1 Right Toe Great o Foam o Other - coverlet Dressing Change Frequency Wound #1 Right Toe Great o Change dressing every other day. Follow-up Appointments Wound #1 Right Toe Great o Return Appointment in 1 week. [place], [name]  ([id number]) Off-Loading Wound #1 Right Toe Great o Other: - ffront off-loader Additional Orders / Instructions Wound #1 Right Toe Great o Increase protein intake. o Activity as tolerated Electronic Signature(s) Signed: 01/05/2017 5:15:21 PM By: Baltazar Najjar MD Signed: 01/05/2017 5:52:49 PM By: Elliot Gurney, BSN, RN, CWS, Kim RN, BSN Entered By:  Elliot Gurney, BSN, RN, CWS, Kim on 01/05/2017 10:38:46 Theresa Jensen ([id number]) -------------------------------------------------------------------------------- Problem List Details Patient Name: KRIPA, FOSKEY Date of Service: 01/05/2017 10:15 AM Medical Record Patient Account Number: [id number] Number: Treating RN: Curtis Sites 1946-03-20 (70 y.o. Other Clinician: Date of Birth/Sex: Female) Treating Maico Mulvehill Primary Care Provider: Ruffin Pyo Provider/Extender: G Referring Provider: Ruffin Pyo Weeks in Treatment: 8 Active Problems ICD-10 Encounter Code Description Active Date Diagnosis E11.621 Type 2 diabetes mellitus with foot ulcer 11/11/2016 Yes L97.512 Non-pressure chronic ulcer of other part of right foot with 11/11/2016 Yes fat layer exposed E11.40 Type 2 diabetes mellitus with diabetic neuropathy, 11/11/2016 Yes unspecified Inactive Problems Resolved Problems ICD-10 Code Description Active Date Resolved Date L03.031 Cellulitis of right toe 11/11/2016 11/11/2016 Electronic Signature(s) Signed: 01/05/2017 5:15:21 PM By: Baltazar Najjar MD Entered By: Baltazar Najjar on 01/05/2017 10:27:53 Theresa Jensen ([id number]) -------------------------------------------------------------------------------- Progress Note Details Patient Name: Theresa Jensen Date of Service: 01/05/2017 10:15 AM Medical Record Patient Account Number: [id number] Number: Treating RN: Curtis Sites 1945-06-17 (70 y.o. Other Clinician: Date of Birth/Sex: Female) Treating Ebin Palazzi Primary Care Provider: Ruffin Pyo Provider/Extender: G Referring Provider: Ruffin Pyo Weeks in Treatment: 8 Subjective History of Present Illness (HPI) 11/10/16 patient presents for evaluation as referral from her primary care provider, Ruffin Pyo, MD who has been treating her right great toe wound. Unfortunately she has had issues with diabetic control and they are working on that. Nonetheless her hemoglobin A1c on last check which was April 2018 with 8.2. She did have an x-ray which was performed and evaluated by her primary on 09/07/16 which is commented in the note to show no bone involvement. I'll see this is good news. Mainly she has been performing wet to dry packing of the wound and on the Sep 07, 2016 encounter it was noted that her primary did pair down some of the callus surrounding the wound. However no excisional debridement of the wound margins especially where it is immediately undermining and rolled is noted. She has been on two rounds of antibiotics clindamycin and Cipro fortunately there does not appear to be any sign of infection at this point. 11/17/16; patient admitted to the clinic last week with a diabetic foot ulcer on the plantar right great toe 11/24/16; plantar aspect of her right great toe dfu Wagner grade 2. Using Prisma 12/01/16; plantar aspect of her right great toe DF U Wagner grade 2 using Prisma without real change. ABI is 1. Previous x-ray showed no bone involvement 12/08/16 on evaluation today patient's ulcer appears to be doing fairly well. She continues to have some callous buildup surrounding the ulcer region and she has some Slough noted on the surface of the wound but overall this is doing fairly well. No fevers, chills, nausea, or vomiting noted at this time. 12/16/16 on evaluation today patient appears to be doing fairly well in regard to her right great toe wound. She has been tolerating dressing changes without complication and continues to wear her offloading shoe. Fortunately  she has no overall worsening symptoms deftly no evidence of infection. She experiences no pain. No fevers, chills, nausea, or vomiting noted at this time. 12/29/16; patient with a slitlike wound on the plantar aspect of her right great toe. She has been packing silver collagen into this wound however the area is 0.8 cm of depth medially. She is wearing a [employer] forefoot offloading boot however it sounds as though she is very active. 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 She has been using silver collagen. [place] [employer] forefoot off loader in spite of this her wound really hasn't looked too bad. She'll be here next week and then absent the next week for her equestrian event. [place], [name] ([id number]) Objective Constitutional Sitting or standing Blood Pressure is within target range for patient.. Pulse regular and within target range for patient.Marland Kitchen Respirations regular, non-labored and within target range.. Temperature is normal and within the target range for the patient.Marland Kitchen appears in no distress. Vitals Time Taken: 10:14 AM, Height: 65 in, Weight: 187 lbs, BMI: 31.1, Temperature: 97.9 F, Pulse: 101 bpm, Respiratory Rate: 16 breaths/min, Blood Pressure: 120/62 mmHg. Cardiovascular Pedal pulses palpable on the right wound exam. General Notes: Wound exam; linear wound area with a slitlike deformity on the plantar aspect of her right great toe. Using a #3 curet I removed nonviable surface callus and then necrotic material from the base of the wound. Hemostasis with direct pressure there is still some depth to this although the base of the wound looks stable. Integumentary (Hair, Skin) Wound #1 status is Open. Original cause of wound was Gradually Appeared. The wound is located on the Right Toe Great. The wound  measures 1cm length x 0.6cm width x 0.4cm depth; 0.471cm^2 area and 0.188cm^3 volume. There is Fat Layer (Subcutaneous Tissue) Exposed exposed. There is no tunneling or undermining noted. There is a medium amount of serosanguineous drainage noted. The wound margin is distinct with the outline attached to the wound base. There is no granulation within the wound bed. There is no necrotic tissue within the wound bed. The periwound skin appearance exhibited: Callus. The periwound skin appearance did not exhibit: Crepitus, Excoriation, Induration, Rash, Scarring, Dry/Scaly, Maceration, Atrophie Blanche, Cyanosis, Ecchymosis, Hemosiderin Staining, Mottled, Pallor, Rubor, Erythema. Periwound temperature was noted as No Abnormality. Assessment Active Problems ICD-10 E11.621 - Type 2 diabetes mellitus with foot ulcer L97.512 - Non-pressure chronic ulcer of other part of right foot with fat layer exposed E11.40 - Type 2 diabetes mellitus with diabetic neuropathy, unspecified [place], Melanee ([id number]) Procedures Wound #1 Pre-procedure diagnosis of Wound #1 is a Diabetic Wound/Ulcer of the Lower Extremity located on the Right Toe Great .Severity of Tissue Pre Debridement is: Fat layer exposed. There was a Skin/Subcutaneous Tissue Debridement ([id number]) debridement with total area of 0.6 sq cm performed by Maxwell Caul, MD. with the following instrument(s): Curette to remove Viable and Non-Viable tissue/material including Callus and Subcutaneous after achieving pain control using Other (lidocaine 4%). A time out was conducted at 10:24, prior to the start of the procedure. A Minimum amount of bleeding was controlled with Pressure. The procedure was tolerated well with a pain level of 0 throughout and a pain level of 0 following the procedure. Post Debridement Measurements: 1.2cm length x 0.8cm width x 0.4cm depth; 0.302cm^3 volume. Character of Wound/Ulcer Post Debridement is stable. Severity  of Tissue Post Debridement is: Fat layer exposed. Post procedure Diagnosis Wound #1: Same as Pre-Procedure Plan #1 change the primary dressing to Endoform #2 this is likely an offloading issue #3 consider a TCC however she has activity she has to be at the week after next, this would have to be in about 3 weeks Electronic Signature(s) Signed: 01/05/2017 5:15:21 PM By: Baltazar Najjar MD Entered By: Baltazar Najjar on 01/05/2017  10:34:05 Theresa RosalesSUTTON, Cookie ([id number]) -------------------------------------------------------------------------------- SuperBill Details Patient Name: Theresa RosalesSUTTON, Tiffani Date of Service: 01/05/2017 Medical Record Patient Account Number: [id number] Number: Treating RN: Curtis SitesDorthy, Joanna 1945/09/23 (70 y.o. Other Clinician: Date of Birth/Sex: Female) Treating Elison Worrel Primary Care Provider: Ruffin PyoBIN, REBECCA Provider/Extender: G Referring Provider: Ruffin PyoBIN, REBECCA Weeks in Treatment: 8 Diagnosis Coding ICD-10 Codes Code Description E11.621 Type 2 diabetes mellitus with foot ulcer L97.512 Non-pressure chronic ulcer of other part of right foot with fat layer exposed E11.40 Type 2 diabetes mellitus with diabetic neuropathy, unspecified Facility Procedures CPT4 Code Description: [id number] 11042 - DEB SUBQ TISSUE 20 SQ CM/< ICD-10 Description Diagnosis E11.621 Type 2 diabetes mellitus with foot ulcer L97.512 Non-pressure chronic ulcer of other part of right fo Modifier: ot with fat la Quantity: 1 yer exposed Physician Procedures CPT4 Code Description: [id number] 11042 - WC PHYS SUBQ TISS 20 SQ CM ICD-10 Description Diagnosis E11.621 Type 2 diabetes mellitus with foot ulcer L97.512 Non-pressure chronic ulcer of other part of right fo Modifier: ot with fat lay Quantity: 1 er exposed Electronic Signature(s) Signed: 01/05/2017 5:15:21 PM By: Baltazar Najjarobson, Kynisha Memon MD Entered By: Baltazar Najjarobson, Josclyn Jensen on 01/05/2017 10:34:24

## 2017-01-07 NOTE — Progress Notes (Signed)
Theresa Jensen, Theresa Jensen ([id number]) Visit Report for 01/05/2017 Arrival Information Details Patient Name: Theresa Jensen, Theresa Jensen Date of Service: 01/05/2017 10:15 AM Medical Record Patient Account Number: [id number] Number: Treating RN: Huel CoventryWoody, Kim 1945-12-30 (70 y.o. Other Clinician: Date of Birth/Sex: Female) Treating ROBSON, MICHAEL Primary Care Royanne Warshaw: Ruffin PyoBIN, REBECCA Nicandro Perrault/Extender: G Referring Kasie Leccese: Ruffin PyoBIN, REBECCA Weeks in Treatment: 8 Visit Information History Since Last Visit Added or deleted any medications: No Patient Arrived: Ambulatory Any new allergies or adverse reactions: No Arrival Time: 10:14 Had a fall or experienced change in No Accompanied By: self activities of daily living that may affect Transfer Assistance: None risk of falls: Patient Identification Verified: Yes Signs or symptoms of abuse/neglect since last No Secondary Verification Process Yes visito Completed: Has Dressing in Place as Prescribed: Yes Patient Requires Transmission- No Has Footwear/Offloading in Place as Yes Based Precautions: Prescribed: Patient Has Alerts: Yes Right: Wedge Patient Alerts: Patient on Blood Shoe Thinner Pain Present Now: No Hgb A1C 8.2 (may) Electronic Signature(s) Signed: 01/05/2017 5:52:49 PM By: Elliot GurneyWoody, BSN, RN, CWS, Kim RN, BSN Entered By: Elliot GurneyWoody, BSN, RN, CWS, Kim on 01/05/2017 10:14:34 Theresa Jensen, Theresa Jensen ([id number]) -------------------------------------------------------------------------------- Encounter Discharge Information Details Patient Name: Theresa Jensen, Theresa Jensen Date of Service: 01/05/2017 10:15 AM Medical Record Patient Account Number: [id number] Number: Treating RN: Curtis SitesDorthy, Joanna 1945-12-30 (70 y.o. Other Clinician: Date of Birth/Sex: Female) Treating ROBSON, MICHAEL Primary Care Cydnee Fuquay: Ruffin PyoBIN, REBECCA Siennah Barrasso/Extender: G Referring Derelle Cockrell: Ruffin PyoBIN, REBECCA Weeks in Treatment: 8 Encounter Discharge Information Items Discharge Pain  Level: 0 Discharge Condition: Stable Ambulatory Status: Ambulatory Discharge Destination: Home Transportation: Private Auto Accompanied By: self Schedule Follow-up Appointment: Yes Medication Reconciliation completed Yes and provided to Patient/Care Harrel Ferrone: Patient Clinical Summary of Care: Declined Electronic Signature(s) Signed: 01/05/2017 5:52:49 PM By: Elliot GurneyWoody, BSN, RN, CWS, Kim RN, BSN Entered By: Elliot GurneyWoody, BSN, RN, CWS, Kim on 01/05/2017 10:40:03 Theresa Jensen, Theresa Jensen ([id number]) -------------------------------------------------------------------------------- Lower Extremity Assessment Details Patient Name: Theresa Jensen, Theresa Jensen Date of Service: 01/05/2017 10:15 AM Medical Record Patient Account Number: [id number] Number: Treating RN: Huel CoventryWoody, Kim 1945-12-30 (70 y.o. Other Clinician: Date of Birth/Sex: Female) Treating ROBSON, MICHAEL Primary Care Jaleea Alesi: Ruffin PyoBIN, REBECCA Kayana Thoen/Extender: G Referring Sufyan Meidinger: Ruffin PyoBIN, REBECCA Weeks in Treatment: 8 Vascular Assessment Pulses: Dorsalis Pedis Palpable: [Right:Yes] Posterior Tibial Palpable: [Right:Yes] Extremity colors, hair growth, and conditions: Extremity Color: [Right:Pale] Hair Growth on Extremity: [Right:No] Temperature of Extremity: [Right:Warm] Capillary Refill: [Right:< 3 seconds] Dependent Rubor: [Right:No] Blanched when Elevated: [Right:No] Lipodermatosclerosis: [Right:No] Toe Nail Assessment Left: Right: Thick: No Discolored: No Deformed: No Improper Length and Hygiene: Yes Electronic Signature(s) Signed: 01/05/2017 5:52:49 PM By: Elliot GurneyWoody, BSN, RN, CWS, Kim RN, BSN Entered By: Elliot GurneyWoody, BSN, RN, CWS, Kim on 01/05/2017 10:20:50 Theresa Jensen, Theresa Jensen ([id number]) -------------------------------------------------------------------------------- Multi Wound Chart Details Patient Name: Theresa Jensen, Theresa Jensen Date of Service: 01/05/2017 10:15 AM Medical Record Patient Account Number: [id number] Number: Treating RN:  Huel CoventryWoody, Kim 1945-12-30 (70 y.o. Other Clinician: Date of Birth/Sex: Female) Treating ROBSON, MICHAEL Primary Care Zeb Rawl: Ruffin PyoBIN, REBECCA Markos Theil/Extender: G Referring Emelee Rodocker: Ruffin PyoBIN, REBECCA Weeks in Treatment: 8 Vital Signs Height(in): 65 Pulse(bpm): 101 Weight(lbs): 187 Blood Pressure 120/62 (mmHg): Body Mass Index(BMI): 31 Temperature(F): 97.9 Respiratory Rate 16 (breaths/min): Photos: [1:No Photos] [N/A:N/A] Wound Location: [1:Right Toe Great] [N/A:N/A] Wounding Event: [1:Gradually Appeared] [N/A:N/A] Primary Etiology: [1:Diabetic Wound/Ulcer of N/A the Lower Extremity] Comorbid History: [1:Anemia, Hypertension, Type II Diabetes, Osteoarthritis, Neuropathy] [N/A:N/A] Date Acquired: [1:07/06/2016] [N/A:N/A] Weeks of Treatment: [1:8] [N/A:N/A] Wound Status: [1:Open] [N/A:N/A] Measurements L x W x D 1x0.6x0.4 [N/A:N/A] (cm) Area (cm) : [1:0.471] [N/A:N/A] Volume (cm) : [1:0.188] [  N/A:N/A] % Reduction in Area: [1:-15.40%] [N/A:N/A] % Reduction in Volume: 7.80% [N/A:N/A] Classification: [1:Grade 1] [N/A:N/A] Exudate Amount: [1:Medium] [N/A:N/A] Exudate Type: [1:Serosanguineous] [N/A:N/A] Exudate Color: [1:red, brown] [N/A:N/A] Wound Margin: [1:Distinct, outline attached N/A] Granulation Amount: [1:None Present (0%)] [N/A:N/A] Necrotic Amount: [1:None Present (0%)] [N/A:N/A] Exposed Structures: [1:Fat Layer (Subcutaneous N/A Tissue) Exposed: Yes Fascia: No Tendon: No] Muscle: No Joint: No Bone: No Epithelialization: None N/A N/A Debridement: Debridement (13086- N/A N/A 11047) Pre-procedure 10:24 N/A N/A Verification/Time Out Taken: Pain Control: Other N/A N/A Tissue Debrided: Callus, Subcutaneous N/A N/A Level: Skin/Subcutaneous N/A N/A Tissue Debridement Area (sq 0.6 N/A N/A cm): Instrument: Curette N/A N/A Bleeding: Minimum N/A N/A Hemostasis Achieved: Pressure N/A N/A Procedural Pain: 0 N/A N/A Post Procedural Pain: 0 N/A N/A Debridement Treatment  Procedure was tolerated N/A N/A Response: well Post Debridement 1.2x0.8x0.4 N/A N/A Measurements L x W x D (cm) Post Debridement 0.302 N/A N/A Volume: (cm) Periwound Skin Texture: Callus: Yes N/A N/A Excoriation: No Induration: No Crepitus: No Rash: No Scarring: No Periwound Skin Maceration: No N/A N/A Moisture: Dry/Scaly: No Periwound Skin Color: Atrophie Blanche: No N/A N/A Cyanosis: No Ecchymosis: No Erythema: No Hemosiderin Staining: No Mottled: No Pallor: No Rubor: No Temperature: No Abnormality N/A N/A Tenderness on No N/A N/A Palpation: Wound Preparation: Ulcer Cleansing: N/A N/A Rinsed/Irrigated with Saline Wister, [name] ([id number]) Topical Anesthetic Applied: Other: lidocaine 4% Procedures Performed: Debridement N/A N/A Treatment Notes Electronic Signature(s) Signed: 01/05/2017 5:15:21 PM By: Baltazar Najjar MD Entered By: Baltazar Najjar on 01/05/2017 10:28:11 Theresa Rosales ([id number]) -------------------------------------------------------------------------------- Multi-Disciplinary Care Plan Details Patient Name: Theresa Rosales Date of Service: 01/05/2017 10:15 AM Medical Record Patient Account Number: [id number] Number: Treating RN: Huel Coventry 04/13/1946 (70 y.o. Other Clinician: Date of Birth/Sex: Female) Treating ROBSON, MICHAEL Primary Care Jozey Janco: Ruffin Pyo Shay Jhaveri/Extender: G Referring Hadas Jessop: Ruffin Pyo Weeks in Treatment: 8 Active Inactive ` Orientation to the Wound Care Program Nursing Diagnoses: Knowledge deficit related to the wound healing center program Goals: Patient/caregiver will verbalize understanding of the Wound Healing Center Program Date Initiated: 11/10/2016 Target Resolution Date: 03/13/2017 Goal Status: Active Interventions: Provide education on orientation to the wound center Notes: ` Peripheral Neuropathy Nursing Diagnoses: Knowledge deficit related to disease process and management  of peripheral neurovascular dysfunction Potential alteration in peripheral tissue perfusion (select prior to confirmation of diagnosis) Goals: Patient/caregiver will verbalize understanding of disease process and disease management Date Initiated: 11/10/2016 Target Resolution Date: 03/13/2017 Goal Status: Active Interventions: Assess signs and symptoms of neuropathy upon admission and as needed Provide education on Management of Neuropathy and Related Ulcers Provide education on Management of Neuropathy upon discharge from the Wound Center Notes: PHYLLISS, STREGE ([id number]) Pressure Nursing Diagnoses: Knowledge deficit related to management of pressures ulcers Potential for impaired tissue integrity related to pressure, friction, moisture, and shear Goals: Patient will remain free from development of additional pressure ulcers Date Initiated: 11/10/2016 Target Resolution Date: 03/13/2017 Goal Status: Active Patient will remain free of pressure ulcers Date Initiated: 11/10/2016 Target Resolution Date: 03/13/2017 Goal Status: Active Patient/caregiver will verbalize risk factors for pressure ulcer development Date Initiated: 11/10/2016 Target Resolution Date: 03/13/2017 Goal Status: Active Patient/caregiver will verbalize understanding of pressure ulcer management Date Initiated: 11/10/2016 Target Resolution Date: 03/13/2017 Goal Status: Active Interventions: Assess: immobility, friction, shearing, incontinence upon admission and as needed Assess offloading mechanisms upon admission and as needed Assess potential for pressure ulcer upon admission and as needed Provide education on pressure ulcers Treatment Activities: Patient referred for home evaluation of offloading  devices/mattresses : 11/10/2016 Patient referred for pressure reduction/relief devices : 11/10/2016 Notes: ` Wound/Skin Impairment Nursing Diagnoses: Impaired tissue integrity Knowledge deficit related to  ulceration/compromised skin integrity Goals: Patient/caregiver will verbalize understanding of skin care regimen Date Initiated: 11/10/2016 Target Resolution Date: 03/13/2017 Goal Status: Active Ulcer/skin breakdown will have a volume reduction of 30% by week 4 Date Initiated: 11/10/2016 Target Resolution Date: 03/13/2017 Goal Status: Active KENITA, BINES ([id number]) Ulcer/skin breakdown will have a volume reduction of 50% by week 8 Date Initiated: 11/10/2016 Target Resolution Date: 03/13/2017 Goal Status: Active Ulcer/skin breakdown will have a volume reduction of 80% by week 12 Date Initiated: 11/10/2016 Target Resolution Date: 03/13/2017 Goal Status: Active Ulcer/skin breakdown will heal within 14 weeks Date Initiated: 11/10/2016 Target Resolution Date: 03/13/2017 Goal Status: Active Interventions: Assess patient/caregiver ability to obtain necessary supplies Assess patient/caregiver ability to perform ulcer/skin care regimen upon admission and as needed Assess ulceration(s) every visit Provide education on ulcer and skin care Treatment Activities: Referred to DME Kwane Rohl for dressing supplies : 11/10/2016 Skin care regimen initiated : 11/10/2016 Topical wound management initiated : 11/10/2016 Notes: Electronic Signature(s) Signed: 01/05/2017 5:52:49 PM By: Elliot Gurney, BSN, RN, CWS, Kim RN, BSN Entered By: Elliot Gurney, BSN, RN, CWS, Kim on 01/05/2017 10:23:12 Theresa Rosales ([id number]) -------------------------------------------------------------------------------- Pain Assessment Details Patient Name: Theresa Rosales Date of Service: 01/05/2017 10:15 AM Medical Record Patient Account Number: [id number] Number: Treating RN: Huel Coventry October 12, 1945 (70 y.o. Other Clinician: Date of Birth/Sex: Female) Treating ROBSON, MICHAEL Primary Care Alicia Ackert: Ruffin Pyo Hayley Horn/Extender: G Referring Janaysha Depaulo: Ruffin Pyo Weeks in Treatment: 8 Active Problems Location of Pain  Severity and Description of Pain Patient Has Paino No Site Locations With Dressing Change: No Pain Management and Medication Current Pain Management: Electronic Signature(s) Signed: 01/05/2017 5:52:49 PM By: Elliot Gurney, BSN, RN, CWS, Kim RN, BSN Entered By: Elliot Gurney, BSN, RN, CWS, Kim on 01/05/2017 10:14:47 Theresa Rosales ([id number]) -------------------------------------------------------------------------------- Patient/Caregiver Education Details Patient Name: Theresa Rosales Date of Service: 01/05/2017 10:15 AM Medical Record Patient Account Number: [id number] Number: Treating RN: Huel Coventry 04/29/1946 (70 y.o. Other Clinician: Date of Birth/Gender: Female) Treating ROBSON, MICHAEL Primary Care Physician/Extender: Milana Kidney, REBECCA Physician: Tania Ade in Treatment: 8 Referring Physician: Ruffin Pyo Education Assessment Education Provided To: Patient Education Topics Provided Wound/Skin Impairment: Handouts: Caring for Your Ulcer Methods: Demonstration Responses: State content correctly Electronic Signature(s) Signed: 01/05/2017 5:52:49 PM By: Elliot Gurney, BSN, RN, CWS, Kim RN, BSN Entered By: Elliot Gurney, BSN, RN, CWS, Kim on 01/05/2017 10:40:14 Theresa Rosales ([id number]) -------------------------------------------------------------------------------- Wound Assessment Details Patient Name: Theresa Rosales Date of Service: 01/05/2017 10:15 AM Medical Record Patient Account Number: [id number] Number: Treating RN: Huel Coventry 04-13-1946 (70 y.o. Other Clinician: Date of Birth/Sex: Female) Treating ROBSON, MICHAEL Primary Care Grover Robinson: Ruffin Pyo Nevaeh Casillas/Extender: G Referring Lovel Suazo: Ruffin Pyo Weeks in Treatment: 8 Wound Status Wound Number: 1 Primary Diabetic Wound/Ulcer of the Lower Etiology: Extremity Wound Location: Right Toe Great Wound Open Wounding Event: Gradually Appeared Status: Date Acquired: 07/06/2016 Comorbid Anemia, Hypertension, Type  II Weeks Of Treatment: 8 History: Diabetes, Osteoarthritis, Neuropathy Clustered Wound: No Photos Photo Uploaded By: Elliot Gurney, BSN, RN, CWS, Kim on 01/06/2017 11:19:38 Wound Measurements Length: (cm) 1 Width: (cm) 0.6 Depth: (cm) 0.4 Area: (cm) 0.471 Volume: (cm) 0.188 % Reduction in Area: -15.4% % Reduction in Volume: 7.8% Epithelialization: None Tunneling: No Undermining: No Wound Description Classification: Grade 1 Foul Odor Afte Wound Margin: Distinct, outline attached Slough/Fibrino Exudate Amount: Medium Exudate Type: Serosanguineous Exudate Color: red, brown r Cleansing: No Yes Wound Bed Granulation  Amount: None Present (0%) Exposed Structure Necrotic Amount: None Present (0%) Fascia Exposed: No Fat Layer (Subcutaneous Tissue) Exposed: Yes Anschutz, [name] ([id number]) Tendon Exposed: No Muscle Exposed: No Joint Exposed: No Bone Exposed: No Periwound Skin Texture Texture Color No Abnormalities Noted: No No Abnormalities Noted: No Callus: Yes Atrophie Blanche: No Crepitus: No Cyanosis: No Excoriation: No Ecchymosis: No Induration: No Erythema: No Rash: No Hemosiderin Staining: No Scarring: No Mottled: No Pallor: No Moisture Rubor: No No Abnormalities Noted: No Dry / Scaly: No Temperature / Pain Maceration: No Temperature: No Abnormality Wound Preparation Ulcer Cleansing: Rinsed/Irrigated with Saline Topical Anesthetic Applied: Other: lidocaine 4%, Treatment Notes Wound #1 (Right Toe Great) 1. Cleansed with: Clean wound with Normal Saline 2. Anesthetic Topical Lidocaine 4% cream to wound bed prior to debridement 4. Dressing Applied: Other dressing (specify in notes) 5. Secondary Dressing Applied Foam Notes Endoform; coverlet, front offload Electronic Signature(s) Signed: 01/05/2017 5:52:49 PM By: Elliot Gurney, BSN, RN, CWS, Kim RN, BSN Entered By: Elliot Gurney, BSN, RN, CWS, Kim on 01/05/2017 10:19:57 Theresa Rosales  ([id number]) -------------------------------------------------------------------------------- Vitals Details Patient Name: Theresa Rosales Date of Service: 01/05/2017 10:15 AM Medical Record Patient Account Number: [id number] Number: Treating RN: Huel Coventry 1945/06/07 (70 y.o. Other Clinician: Date of Birth/Sex: Female) Treating ROBSON, MICHAEL Primary Care Carmie Lanpher: Ruffin Pyo Schae Cando/Extender: G Referring Zakariye Nee: Ruffin Pyo Weeks in Treatment: 8 Vital Signs Time Taken: 10:14 Temperature (F): 97.9 Height (in): 65 Pulse (bpm): 101 Weight (lbs): 187 Respiratory Rate (breaths/min): 16 Body Mass Index (BMI): 31.1 Blood Pressure (mmHg): 120/62 Reference Range: 80 - 120 mg / dl Electronic Signature(s) Signed: 01/05/2017 5:52:49 PM By: Elliot Gurney, BSN, RN, CWS, Kim RN, BSN Entered By: Elliot Gurney, BSN, RN, CWS, Kim on 01/05/2017 10:15:08

## 2017-01-11 ENCOUNTER — Encounter: Payer: Medicare Other | Admitting: Internal Medicine

## 2017-01-11 DIAGNOSIS — E11621 Type 2 diabetes mellitus with foot ulcer: Secondary | ICD-10-CM | POA: Diagnosis not present

## 2017-01-13 NOTE — Progress Notes (Signed)
MAITLAND, MUHLBAUER ([id number]) Visit Report for 01/11/2017 Debridement Details Patient Name: Theresa Jensen, Theresa Jensen Date of Service: 01/11/2017 9:45 AM Medical Record Patient Account Number: [id number] Number: Treating RN: Phillis Haggis 1945-10-06 (70 y.o. Other Clinician: Date of Birth/Sex: Female) Treating Kaelyn Innocent Primary Care Provider: Ruffin Pyo Provider/Extender: G Referring Provider: Ruffin Pyo Weeks in Treatment: 8 Debridement Performed for Wound #1 Right Toe Great Assessment: Performed By: Physician Maxwell Caul, MD Debridement: Debridement Severity of Tissue Pre Fat layer exposed Debridement: Pre-procedure Verification/Time Out Yes - 10:17 Taken: Start Time: 10:18 Pain Control: Lidocaine 4% Topical Solution Level: Skin/Subcutaneous Tissue Total Area Debrided (L x 0.5 (cm) x 0.3 (cm) = 0.15 (cm) W): Tissue and other Viable, Non-Viable, Exudate, Fibrin/Slough, Subcutaneous material debrided: Instrument: Curette Bleeding: Large Hemostasis Achieved: Silver Nitrate End Time: 10:20 Procedural Pain: 0 Post Procedural Pain: 0 Response to Treatment: Procedure was tolerated well Post Debridement Measurements of Total Wound Length: (cm) 0.6 Width: (cm) 0.6 Depth: (cm) 0.2 Volume: (cm) 0.057 Character of Wound/Ulcer Post Requires Further Debridement Debridement: Severity of Tissue Post Debridement: Fat layer exposed Post Procedure Diagnosis Same as Pre-procedure Theresa Jensen, Theresa Jensen ([id number]) Electronic Signature(s) Signed: 01/11/2017 3:57:41 PM By: Baltazar Najjar MD Signed: 01/12/2017 4:54:17 PM By: Alejandro Mulling Entered By: Baltazar Najjar on 01/11/2017 10:59:40 Theresa Jensen ([id number]) -------------------------------------------------------------------------------- HPI Details Patient Name: Theresa Jensen Date of Service: 01/11/2017 9:45 AM Medical Record Patient Account Number: [id number] Number: Treating RN:  Phillis Haggis 03/18/1946 (70 y.o. Other Clinician: Date of Birth/Sex: Female) Treating Jazmin Vensel Primary Care Provider: Ruffin Pyo Provider/Extender: G Referring Provider: Ruffin Pyo Weeks in Treatment: 8 History of Present Illness HPI Description: 11/10/16 patient presents for evaluation as referral from her primary care provider, Ruffin Pyo, MD who has been treating her right great toe wound. Unfortunately she has had issues with diabetic control and they are working on that. Nonetheless her hemoglobin A1c on last check which was April 2018 with 8.2. She did have an x-ray which was performed and evaluated by her primary on 09/07/16 which is commented in the note to show no bone involvement. I'll see this is good news. Mainly she has been performing wet to dry packing of the wound and on the Sep 07, 2016 encounter it was noted that her primary did pair down some of the callus surrounding the wound. However no excisional debridement of the wound margins especially where it is immediately undermining and rolled is noted. She has been on two rounds of antibiotics clindamycin and Cipro fortunately there does not appear to be any sign of infection at this point. 11/17/16; patient admitted to the clinic last week with a diabetic foot ulcer on the plantar right great toe 11/24/16; plantar aspect of her right great toe dfu Wagner grade 2. Using Prisma 12/01/16; plantar aspect of her right great toe DF U Wagner grade 2 using Prisma without real change. ABI is 1. Previous x-ray showed no bone involvement 12/08/16 on evaluation today patient's ulcer appears to be doing fairly well. She continues to have some callous buildup surrounding the ulcer region and she has some Slough noted on the surface of the wound but overall this is doing fairly well. No fevers, chills, nausea, or vomiting noted at this time. 12/16/16 on evaluation today patient appears to be doing fairly well in regard to her  right great toe wound. She has been tolerating dressing changes without complication and continues to wear her offloading shoe. Fortunately she has no overall worsening symptoms deftly no evidence of  infection. She experiences no pain. No fevers, chills, nausea, or vomiting noted at this time. 12/29/16; patient with a slitlike wound on the plantar aspect of her right great toe. She has been packing silver collagen into this wound however the area is 0.8 cm of depth medially. She is wearing a [employer] forefoot offloading boot however it sounds as though she is very active. 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 She has been using silver collagen. [place] [employer] forefoot off loader in spite of this her wound really hasn't looked too bad. She'll be here next week and then absent the next week for her equestrian event. 01/11/17; small slitlike wound on the plantar aspect of her right great toe however with considerable relative depth. I changed her to Endoform last week although her intake nurse was not sure that's what she was using. She is using a [employer] forefoot off loader however she is a very active woman and has difficulty with the idea of a total contact cast especially as it applies to driving. She does not have obvious arterial issues. Consideration will need to be given of imaging studies repeated. Electronic Signature(s) Signed: 01/11/2017 3:57:41 PM By: Baltazar Najjar MD Theresa Jensen, Theresa Jensen ([id number]) Entered By: Baltazar Najjar on 01/11/2017 11:01:49 Theresa Jensen ([id number]) -------------------------------------------------------------------------------- Physical Exam Details Patient Name: Theresa Jensen Date of Service: 01/11/2017 9:45 AM Medical Record Patient Account Number:  [id number] Number: Treating RN: Phillis Haggis 1945-07-01 (70 y.o. Other Clinician: Date of Birth/Sex: Female) Treating Brook Geraci Primary Care Provider: Ruffin Pyo Provider/Extender: G Referring Provider: Ruffin Pyo Weeks in Treatment: 8 Constitutional Sitting or standing Blood Pressure is within target range for patient.. Pulse regular and within target range for patient.Marland Kitchen Respirations regular, non-labored and within target range.. Temperature is normal and within the target range for the patient.Marland Kitchen appears in no distress. Cardiovascular Pedal pulses are brisk on the right. Notes When exam; not much change her from last week. Each time she comes in today narrow slitlike wound but with considerable depth. Rolled edges of callus take skin and subcutaneous tissue required debridement to open up the wound to see if this will fill in. It is not overtly infected. Post debridement the tissue looks healthy at the base of this. Treatment was with a #3 curet, hemostasis with direct pressure Electronic Signature(s) Signed: 01/11/2017 3:57:41 PM By: Baltazar Najjar MD Entered By: Baltazar Najjar on 01/11/2017 11:03:19 Theresa Jensen ([id number]) -------------------------------------------------------------------------------- Physician Orders Details Patient Name: Theresa Jensen Date of Service: 01/11/2017 9:45 AM Medical Record Patient Account Number: [id number] Number: Treating RN: Phillis Haggis 1946/02/04 (70 y.o. Other Clinician: Date of Birth/Sex: Female) Treating Aldea Avis Primary Care Provider: Ruffin Pyo Provider/Extender: G Referring Provider: Ruffin Pyo Weeks in Treatment: 8 Verbal / Phone Orders: Yes Clinician: Ashok Cordia, Debi Read Back and Verified: Yes Diagnosis Coding Wound Cleansing Wound #1 Right Toe Great o Cleanse wound with mild soap and water o May Shower, gently pat wound dry prior to applying new  dressing. o May shower with protection. Anesthetic Wound #1 Right Toe Great o Topical Lidocaine 4% cream applied to wound bed prior to debridement - in clinic Primary Wound Dressing Wound #1 Right Toe Great o Other: - endoform Secondary Dressing Wound #1 Right Toe Great o Foam o Other - coverlet Dressing Change  Frequency Wound #1 Right Toe Great o Change dressing every other day. Follow-up Appointments Wound #1 Right Toe Great o Return Appointment in 1 week. Off-Loading Wound #1 Right Toe Great o Other: - ffront off-loader Additional Orders / Instructions Wound #1 Right Toe [place], Salisha ([id number]) o Increase protein intake. o Activity as tolerated Electronic Signature(s) Signed: 01/11/2017 3:57:41 PM By: Baltazar Najjarobson, Maiko Salais MD Signed: 01/12/2017 4:54:17 PM By: Alejandro MullingPinkerton, Debra Entered By: Alejandro MullingPinkerton, Debra on 01/11/2017 10:29:38 Theresa Jensen, Theresa Jensen ([id number]) -------------------------------------------------------------------------------- Problem List Details Patient Name: Theresa Jensen, Theresa Jensen Date of Service: 01/11/2017 9:45 AM Medical Record Patient Account Number: [id number] Number: Treating RN: Phillis Haggisinkerton, Debi 30-Jun-1945 (70 y.o. 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 E11.40 Type 2 diabetes mellitus with diabetic neuropathy, 11/11/2016 Yes unspecified Inactive Problems Resolved Problems ICD-10 Code Description Active Date Resolved Date L03.031 Cellulitis of right toe 11/11/2016 11/11/2016 Electronic Signature(s) Signed: 01/11/2017 3:57:41 PM By: Baltazar Najjarobson,  Rhapsody Wolven MD Entered By: Baltazar Najjarobson, Shuntay Everetts on 01/11/2017 10:57:04 Theresa Jensen, Theresa Jensen ([id number]) -------------------------------------------------------------------------------- Progress Note Details Patient Name: Theresa Jensen, Theresa Jensen Date of Service: 01/11/2017 9:45 AM Medical Record Patient Account Number: [id number] Number: Treating RN: Phillis Haggisinkerton, Debi 30-Jun-1945 (70 y.o. Other Clinician: Date of Birth/Sex: Female) Treating Laniece Hornbaker Primary Care Provider: Ruffin PyoBIN, REBECCA Provider/Extender: G Referring Provider: Ruffin PyoBIN, REBECCA Weeks in Treatment: 8 Subjective History of Present Illness (HPI) 11/10/16 patient presents for evaluation as referral from her primary care provider, Ruffin Pyoebecca Tobin, MD who has been treating her right great toe wound. Unfortunately she has had issues with diabetic control and they are working on that. Nonetheless her hemoglobin A1c on last check which was April 2018 with 8.2. She did have an x-ray which was performed and evaluated by her primary on 09/07/16 which is commented in the note to show no bone involvement. I'll see this is good news. Mainly she has been performing wet to dry packing of the wound and on the Sep 07, 2016 encounter it was noted that her primary did pair down some of the callus surrounding the wound. However no excisional debridement of the wound margins especially where it is immediately undermining and rolled is noted. She has been on two rounds of antibiotics clindamycin and Cipro fortunately there does not appear to be any sign of infection at this point. 11/17/16; patient admitted to the clinic last week with a diabetic foot ulcer on the plantar right great toe 11/24/16; plantar aspect of her right great toe dfu Wagner grade 2. Using Prisma 12/01/16; plantar aspect of her right great toe DF U Wagner grade 2 using Prisma without real change. ABI is 1. Previous x-ray showed no bone involvement 12/08/16 on evaluation today patient's ulcer  appears to be doing fairly well. She continues to have some callous buildup surrounding the ulcer region and she has some Slough noted on the surface of the wound but overall this is doing fairly well. No fevers, chills, nausea, or vomiting noted at this time. 12/16/16 on evaluation today patient appears to be doing fairly well in regard to her right great toe wound. She has been tolerating dressing changes without complication and continues to wear her offloading shoe. Fortunately she has no overall worsening symptoms deftly no evidence of infection.  She experiences no pain. No fevers, chills, nausea, or vomiting noted at this time. 12/29/16; patient with a slitlike wound on the plantar aspect of her right great toe. She has been packing silver collagen into this wound however the area is 0.8 cm of depth medially. She is wearing a [employer] forefoot offloading boot however it sounds as though she is very active. 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 She has been using silver collagen. [place] [employer] forefoot off loader in spite of this her wound really hasn't looked too bad. She'll be here next week and then absent the next week for her equestrian event. 01/11/17; small slitlike wound on the plantar aspect of her right great toe however with considerable relative depth. I changed her to Endoform last week although her intake nurse was not sure that's what she was using. She is using a [employer] forefoot off loader however she is a very active woman and has difficulty with the idea of a total contact cast especially as it applies to driving. She does not have obvious arterial issues. Consideration will need to be given of imaging studies repeated. [place], Theresa Jensen ([id number]) Objective Constitutional Sitting or standing Blood  Pressure is within target range for patient.. Pulse regular and within target range for patient.Marland Kitchen Respirations regular, non-labored and within target range.. Temperature is normal and within the target range for the patient.Marland Kitchen appears in no distress. Vitals Time Taken: 9:51 AM, Height: 65 in, Weight: 187 lbs, BMI: 31.1, Temperature: 98.3 F, Pulse: 97 bpm, Respiratory Rate: 16 breaths/min, Blood Pressure: 128/67 mmHg. Cardiovascular Pedal pulses are brisk on the right. General Notes: When exam; not much change her from last week. Each time she comes in today narrow slitlike wound but with considerable depth. Rolled edges of callus take skin and subcutaneous tissue required debridement to open up the wound to see if this will fill in. It is not overtly infected. Post debridement the tissue looks healthy at the base of this. Treatment was with a #3 curet, hemostasis with direct pressure Integumentary (Hair, Skin) Wound #1 status is Open. Original cause of wound was Gradually Appeared. The wound is located on the Right Toe Great. The wound measures 0.5cm length x 0.3cm width x 0.3cm depth; 0.118cm^2 area and 0.035cm^3 volume. There is Fat Layer (Subcutaneous Tissue) Exposed exposed. There is no tunneling noted, however, there is undermining starting at 6:00 and ending at :00 with a maximum distance of 0.2cm. There is a medium amount of serosanguineous drainage noted. The wound margin is distinct with the outline attached to the wound base. There is medium (34-66%) red granulation within the wound bed. There is a medium (34-66%) amount of necrotic tissue within the wound bed including Adherent Slough. The periwound skin appearance exhibited: Callus. The periwound skin appearance did not exhibit: Crepitus, Excoriation, Induration, Rash, Scarring, Dry/Scaly, Maceration, Atrophie Blanche, Cyanosis, Ecchymosis, Hemosiderin Staining, Mottled, Pallor, Rubor, Erythema. Periwound temperature was noted as  No Abnormality. Assessment Active Problems ICD-10 E11.621 - Type 2 diabetes mellitus with foot ulcer L97.512 - Non-pressure chronic ulcer of other part of right foot with fat layer exposed Gramajo, Kimyah ([id number]) E11.40 - Type 2 diabetes mellitus with diabetic neuropathy, unspecified Procedures Wound #1 Pre-procedure diagnosis of Wound #1 is a Diabetic Wound/Ulcer of the Lower Extremity located  on the Right Toe Great .Severity of Tissue Pre Debridement is: Fat layer exposed. There was a Skin/Subcutaneous Tissue Debridement ([id number]) debridement with total area of 0.15 sq cm performed by Maxwell Caul, MD. with the following instrument(s): Curette to remove Viable and Non-Viable tissue/material including Exudate, Fibrin/Slough, and Subcutaneous after achieving pain control using Lidocaine 4% Topical Solution. A time out was conducted at 10:17, prior to the start of the procedure. A Large amount of bleeding was controlled with Silver Nitrate. The procedure was tolerated well with a pain level of 0 throughout and a pain level of 0 following the procedure. Post Debridement Measurements: 0.6cm length x 0.6cm width x 0.2cm depth; 0.057cm^3 volume. Character of Wound/Ulcer Post Debridement requires further debridement. Severity of Tissue Post Debridement is: Fat layer exposed. Post procedure Diagnosis Wound #1: Same as Pre-Procedure Plan Wound Cleansing: Wound #1 Right Toe Great: Cleanse wound with mild soap and water May Shower, gently pat wound dry prior to applying new dressing. May shower with protection. Anesthetic: Wound #1 Right Toe Great: Topical Lidocaine 4% cream applied to wound bed prior to debridement - in clinic Primary Wound Dressing: Wound #1 Right Toe Great: Other: - endoform Secondary Dressing: Wound #1 Right Toe Great: Foam Other - coverlet Dressing Change Frequency: Wound #1 Right Toe Great: Change dressing every other day. Follow-up  Appointments: Wound #1 Right [address]SAMANTHA, RAGEN ([id number]) Return Appointment in 1 week. Off-Loading: Wound #1 Right Toe Great: Other: - ffront off-loader Additional Orders / Instructions: Wound #1 Right Toe Great: Increase protein intake. Activity as tolerated o #1 we are not making much progress with his wound on the plantar aspect of her right great toe. 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 She states she could reduce this but doesn't see a way that she could avoid driving which would preclude putting a total contact cast on #2 is likely she is going to need repeat imaging of this toe which was done in July before she came into the clinic. #3 I did talk to her about a total contact cast, I'll talk to her again next week. #4 Endoform as the primary dressing Electronic Signature(s) Signed: 01/11/2017 3:57:41 PM By: Baltazar Najjar MD Entered By: Baltazar Najjar on 01/11/2017 11:05:42 Theresa Jensen ([id number]) -------------------------------------------------------------------------------- SuperBill Details Patient Name: Theresa Jensen Date of Service: 01/11/2017 Medical Record Patient Account Number: [id number] Number: Treating RN: Phillis Haggis October 27, 1945 (70 y.o. Other Clinician: Date of Birth/Sex: Female) Treating [place] Primary Care Provider: Ruffin Pyo Provider/Extender: G Referring Provider: Ruffin Pyo Weeks in Treatment: 8 Diagnosis Coding ICD-10 Codes Code Description E11.621 Type 2 diabetes mellitus with foot ulcer L97.512 Non-pressure chronic ulcer of other part of right foot with fat layer exposed E11.40 Type 2 diabetes mellitus with diabetic neuropathy, unspecified Facility Procedures CPT4 Code Description: [id number] 11042 - DEB SUBQ TISSUE 20 SQ CM/< ICD-10 Description Diagnosis E11.621 Type 2 diabetes mellitus with foot ulcer  L97.512 Non-pressure chronic ulcer of other part of right fo Modifier: ot with fat la Quantity: 1 yer exposed Physician Procedures CPT4 Code Description: [id number] 11042 - WC PHYS SUBQ TISS 20 SQ CM ICD-10 Description Diagnosis E11.621 Type 2 diabetes mellitus with foot ulcer L97.512 Non-pressure chronic ulcer of other part of right fo Modifier: ot with fat lay Quantity: 1 er exposed Electronic Signature(s) Signed: 01/11/2017 3:57:41 PM By: Baltazar Najjar MD Entered By: Baltazar Najjar on 01/11/2017 11:06:04

## 2017-01-13 NOTE — Progress Notes (Signed)
Theresa Jensen, Theresa Jensen ([id number]) Visit Report for 01/11/2017 Arrival Information Details Patient Name: Theresa Jensen, [name] Date of Service: 01/11/2017 9:45 AM Medical Record Patient Account Number: [id number] Number: Treating RN: Phillis Haggisinkerton, Debi 1945/09/05 (71 y.o. Other Clinician: Date of Birth/Sex: Female) Treating ROBSON, MICHAEL Primary Care Renalda Locklin: Ruffin PyoBIN, REBECCA Dannah Ryles/Extender: G Referring Jayro Mcmath: Ruffin PyoBIN, REBECCA Weeks in Treatment: 8 Visit Information History Since Last Visit All ordered tests and consults were completed: No Patient Arrived: Ambulatory Added or deleted any medications: No Arrival Time: 09:48 Any new allergies or adverse reactions: No Accompanied By: self Had a fall or experienced change in No Transfer Assistance: None activities of daily living that may affect Patient Identification Verified: Yes risk of falls: Secondary Verification Process Yes Signs or symptoms of abuse/neglect since last No Completed: visito Patient Requires Transmission- No Hospitalized since last visit: No Based Precautions: Has Dressing in Place as Prescribed: Yes Patient Has Alerts: Yes Pain Present Now: No Patient Alerts: Patient on Blood Thinner Hgb A1C 8.2 (may) Electronic Signature(s) Signed: 01/12/2017 4:54:17 PM By: Alejandro MullingPinkerton, Debra Entered By: Alejandro MullingPinkerton, Debra on 01/11/2017 09:51:27 Theresa Jensen, Theresa Jensen ([id number]) -------------------------------------------------------------------------------- Encounter Discharge Information Details Patient Name: Theresa Jensen, [place] Date of Service: 01/11/2017 9:45 AM Medical Record Patient Account Number: [id number] Number: Treating RN: Phillis Haggisinkerton, Debi 1945/09/05 (71 y.o. Other Clinician: Date of Birth/Sex: Female) Treating ROBSON, MICHAEL Primary Care Modest Draeger: Ruffin PyoBIN, REBECCA Loyd Salvador/Extender: G Referring Noble Bodie: Ruffin PyoBIN, REBECCA Weeks in Treatment: 8 Encounter Discharge Information Items Discharge Pain Level:  0 Discharge Condition: Stable Ambulatory Status: Ambulatory Discharge Destination: Home Transportation: Private Auto Accompanied By: self Schedule Follow-up Appointment: Yes Medication Reconciliation completed and provided to Patient/Care No Kavina Cantave: Provided on Clinical Summary of Care: 01/11/2017 Form Type Recipient Paper Patient DS Electronic Signature(s) Signed: 01/12/2017 4:54:17 PM By: Alejandro MullingPinkerton, Debra Entered By: Alejandro MullingPinkerton, Debra on 01/11/2017 12:33:12 Theresa Jensen, [name] ([id number]) -------------------------------------------------------------------------------- Lower Extremity Assessment Details Patient Name: Theresa Jensen, Kristiann Date of Service: 01/11/2017 9:45 AM Medical Record Patient Account Number: [id number] Number: Treating RN: Phillis Haggisinkerton, Debi 1945/09/05 (71 y.o. Other Clinician: Date of Birth/Sex: Female) Treating ROBSON, MICHAEL Primary Care Torianne Laflam: Ruffin PyoBIN, REBECCA Kenyotta Dorfman/Extender: G Referring Alane Hanssen: Ruffin PyoBIN, REBECCA Weeks in Treatment: 8 Vascular Assessment Pulses: Dorsalis Pedis Palpable: [Right:Yes] Posterior Tibial Extremity colors, hair growth, and conditions: Extremity Color: [Right:Pale] Temperature of Extremity: [Right:Warm] Capillary Refill: [Right:< 3 seconds] Toe Nail Assessment Left: Right: Thick: Yes Discolored: Yes Deformed: No Improper Length and Hygiene: Yes Electronic Signature(s) Signed: 01/12/2017 4:54:17 PM By: Alejandro MullingPinkerton, Debra Entered By: Alejandro MullingPinkerton, Debra on 01/11/2017 09:59:36 Seidner, Gavin PoundEBORAH ([id number]) -------------------------------------------------------------------------------- Multi Wound Chart Details Patient Name: Theresa Jensen, Theresa Jensen Date of Service: 01/11/2017 9:45 AM Medical Record Patient Account Number: [id number] Number: Treating RN: Phillis Haggisinkerton, Debi 1945/09/05 (71 y.o. Other Clinician: Date of Birth/Sex: Female) Treating ROBSON, MICHAEL Primary Care Leeah Politano: Ruffin PyoBIN, REBECCA Shanigua Gibb/Extender:  G Referring Addisson Frate: Ruffin PyoBIN, REBECCA Weeks in Treatment: 8 Vital Signs Height(in): 65 Pulse(bpm): 97 Weight(lbs): 187 Blood Pressure 128/67 (mmHg): Body Mass Index(BMI): 31 Temperature(F): 98.3 Respiratory Rate 16 (breaths/min): Photos: [1:No Photos] [N/A:N/A] Wound Location: [1:Right Toe Great] [N/A:N/A] Wounding Event: [1:Gradually Appeared] [N/A:N/A] Primary Etiology: [1:Diabetic Wound/Ulcer of N/A the Lower Extremity] Comorbid History: [1:Anemia, Hypertension, Type II Diabetes, Osteoarthritis, Neuropathy] [N/A:N/A] Date Acquired: [1:07/06/2016] [N/A:N/A] Weeks of Treatment: [1:8] [N/A:N/A] Wound Status: [1:Open] [N/A:N/A] Measurements L x W x D 0.5x0.3x0.3 [N/A:N/A] (cm) Area (cm) : [1:0.118] [N/A:N/A] Volume (cm) : [1:0.035] [N/A:N/A] % Reduction in Area: [1:71.10%] [N/A:N/A] % Reduction in Volume: 82.80% [N/A:N/A] Starting Position 1 6 (o'clock): Ending Position 1 (o'clock): Maximum Distance 1 0.2 (cm): Undermining: [1:Yes] [N/A:N/A]  Classification: [1:Grade 1] [N/A:N/A] Exudate Amount: [1:Medium] [N/A:N/A] Exudate Type: [1:Serosanguineous] [N/A:N/A] Exudate Color: [1:red, brown] [N/A:N/A] Wound Margin: Distinct, outline attached N/A N/A Granulation Amount: Medium (34-66%) N/A N/A Granulation Quality: Red N/A N/A Necrotic Amount: Medium (34-66%) N/A N/A Exposed Structures: Fat Layer (Subcutaneous N/A N/A Tissue) Exposed: Yes Fascia: No Tendon: No Muscle: No Joint: No Bone: No Epithelialization: None N/A N/A Debridement: Debridement (16109- N/A N/A 11047) Pre-procedure 10:17 N/A N/A Verification/Time Out Taken: Pain Control: Lidocaine 4% Topical N/A N/A Solution Tissue Debrided: Fibrin/Slough, Exudates, N/A N/A Subcutaneous Level: Skin/Subcutaneous N/A N/A Tissue Debridement Area (sq 0.15 N/A N/A cm): Instrument: Curette N/A N/A Bleeding: Large N/A N/A Hemostasis Achieved: Silver Nitrate N/A N/A Procedural Pain: 0 N/A N/A Post Procedural  Pain: 0 N/A N/A Debridement Treatment Procedure was tolerated N/A N/A Response: well Post Debridement 0.6x0.6x0.2 N/A N/A Measurements L x W x D (cm) Post Debridement 0.057 N/A N/A Volume: (cm) Periwound Skin Texture: Callus: Yes N/A N/A Excoriation: No Induration: No Crepitus: No Rash: No Scarring: No Periwound Skin Maceration: No N/A N/A Moisture: Dry/Scaly: No Periwound Skin Color: Atrophie Blanche: No N/A N/A Cyanosis: No Ecchymosis: No Erythema: No Hemosiderin Staining: No Lipkin, Samarie ([id number]) Mottled: No Pallor: No Rubor: No Temperature: No Abnormality N/A N/A Tenderness on No N/A N/A Palpation: Wound Preparation: Ulcer Cleansing: N/A N/A Rinsed/Irrigated with Saline Topical Anesthetic Applied: Other: lidocaine 4% Procedures Performed: Debridement N/A N/A Treatment Notes Electronic Signature(s) Signed: 01/11/2017 3:57:41 PM By: Baltazar Najjar MD Entered By: Baltazar Najjar on 01/11/2017 10:59:00 Theresa Rosales ([id number]) -------------------------------------------------------------------------------- Multi-Disciplinary Care Plan Details Patient Name: Theresa Rosales Date of Service: 01/11/2017 9:45 AM Medical Record Patient Account Number: [id number] Number: Treating RN: Phillis Haggis 08-01-1945 (70 y.o. Other Clinician: Date of Birth/Sex: Female) Treating ROBSON, MICHAEL Primary Care Jecenia Leamer: Ruffin Pyo Donnavin Vandenbrink/Extender: G Referring Armarion Greek: Ruffin Pyo Weeks in Treatment: 8 Active Inactive ` Orientation to the Wound Care Program Nursing Diagnoses: Knowledge deficit related to the wound healing center program Goals: Patient/caregiver will verbalize understanding of the Wound Healing Center Program Date Initiated: 11/10/2016 Target Resolution Date: 03/13/2017 Goal Status: Active Interventions: Provide education on orientation to the wound center Notes: ` Peripheral Neuropathy Nursing Diagnoses: Knowledge  deficit related to disease process and management of peripheral neurovascular dysfunction Potential alteration in peripheral tissue perfusion (select prior to confirmation of diagnosis) Goals: Patient/caregiver will verbalize understanding of disease process and disease management Date Initiated: 11/10/2016 Target Resolution Date: 03/13/2017 Goal Status: Active Interventions: Assess signs and symptoms of neuropathy upon admission and as needed Provide education on Management of Neuropathy and Related Ulcers Provide education on Management of Neuropathy upon discharge from the Wound Center Notes: ANTONELLA, UPSON ([id number]) Pressure Nursing Diagnoses: Knowledge deficit related to management of pressures ulcers Potential for impaired tissue integrity related to pressure, friction, moisture, and shear Goals: Patient will remain free from development of additional pressure ulcers Date Initiated: 11/10/2016 Target Resolution Date: 03/13/2017 Goal Status: Active Patient will remain free of pressure ulcers Date Initiated: 11/10/2016 Target Resolution Date: 03/13/2017 Goal Status: Active Patient/caregiver will verbalize risk factors for pressure ulcer development Date Initiated: 11/10/2016 Target Resolution Date: 03/13/2017 Goal Status: Active Patient/caregiver will verbalize understanding of pressure ulcer management Date Initiated: 11/10/2016 Target Resolution Date: 03/13/2017 Goal Status: Active Interventions: Assess: immobility, friction, shearing, incontinence upon admission and as needed Assess offloading mechanisms upon admission and as needed Assess potential for pressure ulcer upon admission and as needed Provide education on pressure ulcers Treatment Activities: Patient referred for home evaluation of offloading devices/mattresses :  11/10/2016 Patient referred for pressure reduction/relief devices : 11/10/2016 Notes: ` Wound/Skin Impairment Nursing Diagnoses: Impaired  tissue integrity Knowledge deficit related to ulceration/compromised skin integrity Goals: Patient/caregiver will verbalize understanding of skin care regimen Date Initiated: 11/10/2016 Target Resolution Date: 03/13/2017 Goal Status: Active Ulcer/skin breakdown will have a volume reduction of 30% by week 4 Date Initiated: 11/10/2016 Target Resolution Date: 03/13/2017 Goal Status: Active KIKUE, GERHART ([id number]) Ulcer/skin breakdown will have a volume reduction of 50% by week 8 Date Initiated: 11/10/2016 Target Resolution Date: 03/13/2017 Goal Status: Active Ulcer/skin breakdown will have a volume reduction of 80% by week 12 Date Initiated: 11/10/2016 Target Resolution Date: 03/13/2017 Goal Status: Active Ulcer/skin breakdown will heal within 14 weeks Date Initiated: 11/10/2016 Target Resolution Date: 03/13/2017 Goal Status: Active Interventions: Assess patient/caregiver ability to obtain necessary supplies Assess patient/caregiver ability to perform ulcer/skin care regimen upon admission and as needed Assess ulceration(s) every visit Provide education on ulcer and skin care Treatment Activities: Referred to DME Jacqulene Huntley for dressing supplies : 11/10/2016 Skin care regimen initiated : 11/10/2016 Topical wound management initiated : 11/10/2016 Notes: Electronic Signature(s) Signed: 01/12/2017 4:54:17 PM By: Alejandro Mulling Entered By: Alejandro Mulling on 01/11/2017 10:02:15 Theresa Rosales ([id number]) -------------------------------------------------------------------------------- Pain Assessment Details Patient Name: Theresa Rosales Date of Service: 01/11/2017 9:45 AM Medical Record Patient Account Number: [id number] Number: Treating RN: Phillis Haggis 1945/08/04 (70 y.o. Other Clinician: Date of Birth/Sex: Female) Treating ROBSON, MICHAEL Primary Care Jamyra Zweig: Ruffin Pyo Maxamillion Banas/Extender: G Referring Victorio Creeden: Ruffin Pyo Weeks in Treatment: 8 Active  Problems Location of Pain Severity and Description of Pain Patient Has Paino No Site Locations Pain Management and Medication Current Pain Management: Electronic Signature(s) Signed: 01/12/2017 4:54:17 PM By: Alejandro Mulling Entered By: Alejandro Mulling on 01/11/2017 09:51:33 Theresa Rosales ([id number]) -------------------------------------------------------------------------------- Patient/Caregiver Education Details Patient Name: Theresa Rosales Date of Service: 01/11/2017 9:45 AM Medical Record Patient Account Number: [id number] Number: Treating RN: Phillis Haggis Apr 13, 1946 (70 y.o. Other Clinician: Date of Birth/Gender: Female) Treating ROBSON, MICHAEL Primary Care Physician/Extender: Milana Kidney, REBECCA Physician: Tania Ade in Treatment: 8 Referring Physician: Ruffin Pyo Education Assessment Education Provided To: Patient Education Topics Provided Wound/Skin Impairment: Handouts: Other: change dressing as ordered Methods: Demonstration, Explain/Verbal Responses: State content correctly Electronic Signature(s) Signed: 01/12/2017 4:54:17 PM By: Alejandro Mulling Entered By: Alejandro Mulling on 01/11/2017 10:03:00 Theresa Rosales ([id number]) -------------------------------------------------------------------------------- Wound Assessment Details Patient Name: Theresa Rosales Date of Service: 01/11/2017 9:45 AM Medical Record Patient Account Number: [id number] Number: Treating RN: Phillis Haggis 01/09/1946 (70 y.o. Other Clinician: Date of Birth/Sex: Female) Treating ROBSON, MICHAEL Primary Care Mandeep Ferch: Ruffin Pyo Pratik Dalziel/Extender: G Referring Corday Wyka: Ruffin Pyo Weeks in Treatment: 8 Wound Status Wound Number: 1 Primary Diabetic Wound/Ulcer of the Lower Etiology: Extremity Wound Location: Right Toe Great Wound Open Wounding Event: Gradually Appeared Status: Date Acquired: 07/06/2016 Comorbid Anemia, Hypertension, Type II Weeks  Of Treatment: 8 History: Diabetes, Osteoarthritis, Neuropathy Clustered Wound: No Photos Photo Uploaded By: Elliot Gurney, BSN, RN, CWS, Kim on 01/11/2017 15:58:47 Wound Measurements Length: (cm) 0.5 % Reduction in Width: (cm) 0.3 % Reduction in Depth: (cm) 0.3 Epithelializati Area: (cm) 0.118 Tunneling: Volume: (cm) 0.035 Undermining: Starting Pos Maximum Dist Area: 71.1% Volume: 82.8% on: None No Yes ition (o'clock): 6 ance: (cm) 0.2 Wound Description Classification: Grade 1 Foul Odor After Wound Margin: Distinct, outline attached Slough/Fibrino Exudate Amount: Medium Exudate Type: Serosanguineous Exudate Color: red, brown Cleansing: No Yes Wound Bed Colarusso, Caylynn ([id number]) Granulation Amount: Medium (34-66%) Exposed Structure Granulation Quality: Red Fascia Exposed: No Necrotic Amount: Medium (34-66%)  Fat Layer (Subcutaneous Tissue) Exposed: Yes Necrotic Quality: Adherent Slough Tendon Exposed: No Muscle Exposed: No Joint Exposed: No Bone Exposed: No Periwound Skin Texture Texture Color No Abnormalities Noted: No No Abnormalities Noted: No Callus: Yes Atrophie Blanche: No Crepitus: No Cyanosis: No Excoriation: No Ecchymosis: No Induration: No Erythema: No Rash: No Hemosiderin Staining: No Scarring: No Mottled: No Pallor: No Moisture Rubor: No No Abnormalities Noted: No Dry / Scaly: No Temperature / Pain Maceration: No Temperature: No Abnormality Wound Preparation Ulcer Cleansing: Rinsed/Irrigated with Saline Topical Anesthetic Applied: Other: lidocaine 4%, Treatment Notes Wound #1 (Right Toe Great) 1. Cleansed with: Clean wound with Normal Saline 2. Anesthetic Topical Lidocaine 4% cream to wound bed prior to debridement 4. Dressing Applied: Other dressing (specify in notes) Notes Endoform; coverlet, front offload Electronic Signature(s) Signed: 01/12/2017 4:54:17 PM By: Alejandro Mulling Entered By: Alejandro Mulling on 01/11/2017  09:57:59 Theresa Rosales ([id number]) -------------------------------------------------------------------------------- Vitals Details Patient Name: Theresa Rosales Date of Service: 01/11/2017 9:45 AM Medical Record Patient Account Number: [id number] Number: Treating RN: Phillis Haggis Oct 24, 1945 (70 y.o. Other Clinician: Date of Birth/Sex: Female) Treating ROBSON, MICHAEL Primary Care Zaahir Pickney: Ruffin Pyo Levie Wages/Extender: G Referring Rafel Garde: Ruffin Pyo Weeks in Treatment: 8 Vital Signs Time Taken: 09:51 Temperature (F): 98.3 Height (in): 65 Pulse (bpm): 97 Weight (lbs): 187 Respiratory Rate (breaths/min): 16 Body Mass Index (BMI): 31.1 Blood Pressure (mmHg): 128/67 Reference Range: 80 - 120 mg / dl Electronic Signature(s) Signed: 01/12/2017 4:54:17 PM By: Alejandro Mulling Entered By: Alejandro Mulling on 01/11/2017 09:53:11

## 2017-01-19 ENCOUNTER — Encounter: Payer: Medicare Other | Admitting: Internal Medicine

## 2017-01-19 DIAGNOSIS — E11621 Type 2 diabetes mellitus with foot ulcer: Secondary | ICD-10-CM | POA: Diagnosis not present

## 2017-01-21 NOTE — Progress Notes (Signed)
Theresa Jensen, Theresa Jensen ([id number]) Visit Report for 01/19/2017 Arrival Information Details Patient Name: Theresa Jensen, Theresa Jensen Date of Service: 01/19/2017 1:30 PM Medical Record Patient Account Number: [id number] Number: Treating RN: Huel Coventry Nov 26, 1945 (70 y.o. Other Clinician: Date of Birth/Sex: Female) Treating ROBSON, MICHAEL Primary Care Eugen Jeansonne: Ruffin Pyo Carilyn Woolston/Extender: G Referring Glenisha Gundry: Ruffin Pyo Weeks in Treatment: 10 Visit Information History Since Last Visit Added or deleted any medications: No Patient Arrived: Ambulatory Any new allergies or adverse reactions: No Arrival Time: 14:17 Had a fall or experienced change in No Accompanied By: self activities of daily living that may affect Transfer Assistance: None risk of falls: Patient Identification Verified: Yes Signs or symptoms of abuse/neglect since last No Secondary Verification Process Yes visito Completed: Hospitalized since last visit: No Patient Requires Transmission- No Has Dressing in Place as Prescribed: Yes Based Precautions: Pain Present Now: No Patient Has Alerts: Yes Patient Alerts: Patient on Blood Thinner Hgb A1C 8.2 (may) Electronic Signature(s) Signed: 01/19/2017 6:22:17 PM By: Elliot Gurney, BSN, RN, CWS, Kim RN, BSN Entered By: Elliot Gurney, BSN, RN, CWS, Kim on 01/19/2017 14:17:41 Theresa Jensen ([id number]) -------------------------------------------------------------------------------- Encounter Discharge Information Details Patient Name: Theresa Jensen Date of Service: 01/19/2017 1:30 PM Medical Record Patient Account Number: [id number] Number: Treating RN: Curtis Sites 07/09/1945 (70 y.o. Other Clinician: Date of Birth/Sex: Female) Treating ROBSON, MICHAEL Primary Care Ladona Rosten: Ruffin Pyo Rees Santistevan/Extender: G Referring Ranveer Wahlstrom: Ruffin Pyo Weeks in Treatment: 10 Encounter Discharge Information Items Discharge Pain Level: 0 Discharge Condition:  Stable Ambulatory Status: Ambulatory Discharge Destination: Home Transportation: Private Auto Accompanied By: self Schedule Follow-up Appointment: Yes Medication Reconciliation completed and provided to Patient/Care Yes Yanai Hobson: Provided on Clinical Summary of Care: 01/19/2017 Form Type Recipient Paper Patient DS Electronic Signature(s) Signed: 01/19/2017 6:22:17 PM By: Elliot Gurney, BSN, RN, CWS, Kim RN, BSN Entered By: Elliot Gurney, BSN, RN, CWS, Kim on 01/19/2017 17:58:58 Theresa Jensen ([id number]) -------------------------------------------------------------------------------- Lower Extremity Assessment Details Patient Name: Theresa Jensen Date of Service: 01/19/2017 1:30 PM Medical Record Patient Account Number: [id number] Number: Treating RN: Huel Coventry August 22, 1945 (70 y.o. Other Clinician: Date of Birth/Sex: Female) Treating ROBSON, MICHAEL Primary Care Novaleigh Kohlman: Ruffin Pyo Haydin Dunn/Extender: G Referring Ramses Klecka: Ruffin Pyo Weeks in Treatment: 10 Vascular Assessment Pulses: Dorsalis Pedis Palpable: [Right:Yes] Posterior Tibial Extremity colors, hair growth, and conditions: Extremity Color: [Right:Normal] Hair Growth on Extremity: [Right:No] Temperature of Extremity: [Right:Warm] Capillary Refill: [Right:< 3 seconds] Toe Nail Assessment Left: Right: Thick: No Discolored: No Deformed: No Improper Length and Hygiene: No Electronic Signature(s) Signed: 01/19/2017 6:22:17 PM By: Elliot Gurney, BSN, RN, CWS, Kim RN, BSN Entered By: Elliot Gurney, BSN, RN, CWS, Kim on 01/19/2017 14:23:05 Theresa Jensen ([id number]) -------------------------------------------------------------------------------- Multi Wound Chart Details Patient Name: Theresa Jensen Date of Service: 01/19/2017 1:30 PM Medical Record Patient Account Number: [id number] Number: Treating RN: Huel Coventry [birth year] (70 y.o. Other Clinician: Date of Birth/Sex: Female) Treating ROBSON,  MICHAEL Primary Care Gaylynn Seiple: Ruffin Pyo Salwa Bai/Extender: G Referring Thedora Rings: Ruffin Pyo Weeks in Treatment: 10 Vital Signs Height(in): 65 Pulse(bpm): 98 Weight(lbs): 187 Blood Pressure 115/55 (mmHg): Body Mass Index(BMI): 31 Temperature(F): 98.1 Respiratory Rate 16 (breaths/min): Photos: [1:No Photos] [N/A:N/A] Wound Location: [1:Right Toe Great] [N/A:N/A] Wounding Event: [1:Gradually Appeared] [N/A:N/A] Primary Etiology: [1:Diabetic Wound/Ulcer of N/A the Lower Extremity] Comorbid History: [1:Anemia, Hypertension, Type II Diabetes, Osteoarthritis, Neuropathy] [N/A:N/A] Date Acquired: [1:07/06/2016] [N/A:N/A] Weeks of Treatment: [1:10] [N/A:N/A] Wound Status: [1:Open] [N/A:N/A] Measurements L x W x D 0.6x0.5x0.4 [N/A:N/A] (cm) Area (cm) : [1:0.236] [N/A:N/A] Volume (cm) : [1:0.094] [N/A:N/A] % Reduction in Area: [1:42.20%] [N/A:N/A] % Reduction  in Volume: 53.90% [N/A:N/A] Classification: [1:Grade 1] [N/A:N/A] Exudate Amount: [1:Medium] [N/A:N/A] Exudate Type: [1:Serosanguineous] [N/A:N/A] Exudate Color: [1:red, brown] [N/A:N/A] Wound Margin: [1:Distinct, outline attached N/A] Granulation Amount: [1:Medium (34-66%)] [N/A:N/A] Granulation Quality: [1:Red] [N/A:N/A] Necrotic Amount: [1:Medium (34-66%)] [N/A:N/A] Exposed Structures: [1:Fat Layer (Subcutaneous N/A Tissue) Exposed: Yes Fascia: No] Tendon: No Muscle: No Joint: No Bone: No Epithelialization: None N/A N/A Debridement: Debridement (40981- N/A N/A 11047) Pre-procedure 14:35 N/A N/A Verification/Time Out Taken: Pain Control: Other N/A N/A Tissue Debrided: Fibrin/Slough, N/A N/A Subcutaneous Level: Skin/Subcutaneous N/A N/A Tissue Debridement Area (sq 0.3 N/A N/A cm): Instrument: Curette N/A N/A Bleeding: Minimum N/A N/A Hemostasis Achieved: Pressure N/A N/A Procedural Pain: 0 N/A N/A Post Procedural Pain: 0 N/A N/A Debridement Treatment Procedure was tolerated N/A N/A Response:  well Post Debridement 0.6x0.5x0.4 N/A N/A Measurements L x W x D (cm) Post Debridement 0.094 N/A N/A Volume: (cm) Periwound Skin Texture: Callus: Yes N/A N/A Excoriation: No Induration: No Crepitus: No Rash: No Scarring: No Periwound Skin Maceration: No N/A N/A Moisture: Dry/Scaly: No Periwound Skin Color: Atrophie Blanche: No N/A N/A Cyanosis: No Ecchymosis: No Erythema: No Hemosiderin Staining: No Mottled: No Pallor: No Rubor: No Temperature: No Abnormality N/A N/A Tenderness on No N/A N/A Palpation: Wound Preparation: Ulcer Cleansing: N/A N/A Rinsed/Irrigated with Theresa Jensen ([id number]) Saline Topical Anesthetic Applied: Other: lidocaine 4% Procedures Performed: Debridement N/A N/A Treatment Notes Electronic Signature(s) Signed: 01/19/2017 5:33:43 PM By: Baltazar Najjar MD Entered By: Baltazar Najjar on 01/19/2017 15:31:47 Theresa Jensen ([id number]) -------------------------------------------------------------------------------- Multi-Disciplinary Care Plan Details Patient Name: Theresa Jensen Date of Service: 01/19/2017 1:30 PM Medical Record Patient Account Number: [id number] Number: Treating RN: Huel Coventry 1946/04/22 (70 y.o. Other Clinician: Date of Birth/Sex: Female) Treating ROBSON, MICHAEL Primary Care Ruie Sendejo: Ruffin Pyo Tanisa Lagace/Extender: G Referring Sherma Vanmetre: Ruffin Pyo Weeks in Treatment: 10 Active Inactive ` Orientation to the Wound Care Program Nursing Diagnoses: Knowledge deficit related to the wound healing center program Goals: Patient/caregiver will verbalize understanding of the Wound Healing Center Program Date Initiated: 11/10/2016 Target Resolution Date: 03/13/2017 Goal Status: Active Interventions: Provide education on orientation to the wound center Notes: ` Peripheral Neuropathy Nursing Diagnoses: Knowledge deficit related to disease process and management of peripheral neurovascular  dysfunction Potential alteration in peripheral tissue perfusion (select prior to confirmation of diagnosis) Goals: Patient/caregiver will verbalize understanding of disease process and disease management Date Initiated: 11/10/2016 Target Resolution Date: 03/13/2017 Goal Status: Active Interventions: Assess signs and symptoms of neuropathy upon admission and as needed Provide education on Management of Neuropathy and Related Ulcers Provide education on Management of Neuropathy upon discharge from the Wound Center Notes: Theresa Jensen, Theresa Jensen ([id number]) Pressure Nursing Diagnoses: Knowledge deficit related to management of pressures ulcers Potential for impaired tissue integrity related to pressure, friction, moisture, and shear Goals: Patient will remain free from development of additional pressure ulcers Date Initiated: 11/10/2016 Target Resolution Date: 03/13/2017 Goal Status: Active Patient will remain free of pressure ulcers Date Initiated: 11/10/2016 Target Resolution Date: 03/13/2017 Goal Status: Active Patient/caregiver will verbalize risk factors for pressure ulcer development Date Initiated: 11/10/2016 Target Resolution Date: 03/13/2017 Goal Status: Active Patient/caregiver will verbalize understanding of pressure ulcer management Date Initiated: 11/10/2016 Target Resolution Date: 03/13/2017 Goal Status: Active Interventions: Assess: immobility, friction, shearing, incontinence upon admission and as needed Assess offloading mechanisms upon admission and as needed Assess potential for pressure ulcer upon admission and as needed Provide education on pressure ulcers Treatment Activities: Patient referred for home evaluation of offloading devices/mattresses : 11/10/2016 Patient referred for pressure  reduction/relief devices : 11/10/2016 Notes: ` Wound/Skin Impairment Nursing Diagnoses: Impaired tissue integrity Knowledge deficit related to ulceration/compromised skin  integrity Goals: Patient/caregiver will verbalize understanding of skin care regimen Date Initiated: 11/10/2016 Target Resolution Date: 03/13/2017 Goal Status: Active Ulcer/skin breakdown will have a volume reduction of 30% by week 4 Date Initiated: 11/10/2016 Target Resolution Date: 03/13/2017 Goal Status: Active Theresa Jensen, Theresa Jensen ([id number]) Ulcer/skin breakdown will have a volume reduction of 50% by week 8 Date Initiated: 11/10/2016 Target Resolution Date: 03/13/2017 Goal Status: Active Ulcer/skin breakdown will have a volume reduction of 80% by week 12 Date Initiated: 11/10/2016 Target Resolution Date: 03/13/2017 Goal Status: Active Ulcer/skin breakdown will heal within 14 weeks Date Initiated: 11/10/2016 Target Resolution Date: 03/13/2017 Goal Status: Active Interventions: Assess patient/caregiver ability to obtain necessary supplies Assess patient/caregiver ability to perform ulcer/skin care regimen upon admission and as needed Assess ulceration(s) every visit Provide education on ulcer and skin care Treatment Activities: Referred to DME Yuriel Lopezmartinez for dressing supplies : 11/10/2016 Skin care regimen initiated : 11/10/2016 Topical wound management initiated : 11/10/2016 Notes: Electronic Signature(s) Signed: 01/19/2017 6:22:17 PM By: Elliot Gurney, BSN, RN, CWS, Kim RN, BSN Entered By: Elliot Gurney, BSN, RN, CWS, Kim on 01/19/2017 14:23:46 Theresa Jensen ([id number]) -------------------------------------------------------------------------------- Pain Assessment Details Patient Name: Theresa Jensen Date of Service: 01/19/2017 1:30 PM Medical Record Patient Account Number: [id number] Number: Treating RN: Huel Coventry 1946/01/16 (70 y.o. Other Clinician: Date of Birth/Sex: Female) Treating ROBSON, MICHAEL Primary Care Corin Formisano: Ruffin Pyo Dekari Bures/Extender: G Referring Edit Ricciardelli: Ruffin Pyo Weeks in Treatment: 10 Active Problems Location of Pain Severity and Description of  Pain Patient Has Paino No Site Locations With Dressing Change: No Pain Management and Medication Current Pain Management: Goals for Pain Management Topical or injectable lidocaine is offered to patient for acute pain when surgical debridement is performed. If needed, Patient is instructed to use over the counter pain medication for the following 24-48 hours after debridement. Wound care MDs do not prescribed pain medications. Patient has chronic pain or uncontrolled pain. Patient has been instructed to make an appointment with their Primary Care Physician for pain management. Electronic Signature(s) Signed: 01/19/2017 6:22:17 PM By: Elliot Gurney, BSN, RN, CWS, Kim RN, BSN Entered By: Elliot Gurney, BSN, RN, CWS, Kim on 01/19/2017 14:18:02 Theresa Jensen ([id number]) -------------------------------------------------------------------------------- Patient/Caregiver Education Details Patient Name: Theresa Jensen Date of Service: 01/19/2017 1:30 PM Medical Record Patient Account Number: [id number] Number: Treating RN: Huel Coventry 1946/03/10 (70 y.o. Other Clinician: Date of Birth/Gender: Female) Treating ROBSON, MICHAEL Primary Care Physician/Extender: Milana Kidney, REBECCA Physician: Tania Ade in Treatment: 10 Referring Physician: Ruffin Pyo Education Assessment Education Provided To: Patient Education Topics Provided Wound/Skin Impairment: Handouts: Caring for Your Ulcer Methods: Demonstration Responses: State content correctly Electronic Signature(s) Signed: 01/19/2017 6:22:17 PM By: Elliot Gurney, BSN, RN, CWS, Kim RN, BSN Entered By: Elliot Gurney, BSN, RN, CWS, Kim on 01/19/2017 17:59:20 Theresa Jensen ([id number]) -------------------------------------------------------------------------------- Wound Assessment Details Patient Name: Theresa Jensen Date of Service: 01/19/2017 1:30 PM Medical Record Patient Account Number: [id number] Number: Treating RN: Huel Coventry [birth year] (70 y.o.  Other Clinician: Date of Birth/Sex: Female) Treating ROBSON, MICHAEL Primary Care Kazden Largo: Ruffin Pyo Beatryce Colombo/Extender: G Referring Elzabeth Mcquerry: Ruffin Pyo Weeks in Treatment: 10 Wound Status Wound Number: 1 Primary Diabetic Wound/Ulcer of the Lower Etiology: Extremity Wound Location: Right Toe Great Wound Open Wounding Event: Gradually Appeared Status: Date Acquired: 07/06/2016 Comorbid Anemia, Hypertension, Type II Weeks Of Treatment: 10 History: Diabetes, Osteoarthritis, Neuropathy Clustered Wound: No Photos Photo Uploaded By: Elliot Gurney, BSN, RN, CWS, Kim on 01/19/2017 17:16:02 Wound  Measurements Length: (cm) 0.6 Width: (cm) 0.5 Depth: (cm) 0.4 Area: (cm) 0.236 Volume: (cm) 0.094 % Reduction in Area: 42.2% % Reduction in Volume: 53.9% Epithelialization: None Tunneling: No Undermining: No Wound Description Classification: Grade 1 Foul Odor Aft Wound Margin: Distinct, outline attached Slough/Fibrin Exudate Amount: Medium Exudate Type: Serosanguineous Exudate Color: red, brown er Cleansing: No o Yes Wound Bed Granulation Amount: Medium (34-66%) Exposed Structure Granulation Quality: Red Fascia Exposed: No Necrotic Amount: Medium (34-66%) Fat Layer (Subcutaneous Tissue) Exposed: Yes Necrotic Quality: Adherent Slough Tendon Exposed: No Muscle Exposed: No Theresa Jensen, Theresa Jensen ([id number]) Joint Exposed: No Bone Exposed: No Periwound Skin Texture Texture Color No Abnormalities Noted: No No Abnormalities Noted: No Callus: Yes Atrophie Blanche: No Crepitus: No Cyanosis: No Excoriation: No Ecchymosis: No Induration: No Erythema: No Rash: No Hemosiderin Staining: No Scarring: No Mottled: No Pallor: No Moisture Rubor: No No Abnormalities Noted: No Dry / Scaly: No Temperature / Pain Maceration: No Temperature: No Abnormality Wound Preparation Ulcer Cleansing: Rinsed/Irrigated with Saline Topical Anesthetic Applied: Other: lidocaine 4%, Treatment  Notes Wound #1 (Right Toe Great) 1. Cleansed with: Clean wound with Normal Saline 2. Anesthetic Topical Lidocaine 4% cream to wound bed prior to debridement 4. Dressing Applied: Other dressing (specify in notes) Notes Endoform; coverlet, front offload Electronic Signature(s) Signed: 01/19/2017 6:22:17 PM By: Elliot Gurney, BSN, RN, CWS, Kim RN, BSN Entered By: Elliot Gurney, BSN, RN, CWS, Kim on 01/19/2017 14:21:51 Theresa Jensen ([id number]) -------------------------------------------------------------------------------- Vitals Details Patient Name: Theresa Jensen Date of Service: 01/19/2017 1:30 PM Medical Record Patient Account Number: [id number] Number: Treating RN: Huel Coventry 08-20-1945 (70 y.o. Other Clinician: Date of Birth/Sex: Female) Treating ROBSON, MICHAEL Primary Care Nalany Steedley: Ruffin Pyo Sheryn Aldaz/Extender: G Referring Alyssa Rotondo: Ruffin Pyo Weeks in Treatment: 10 Vital Signs Time Taken: 14:18 Temperature (F): 98.1 Height (in): 65 Pulse (bpm): 98 Weight (lbs): 187 Respiratory Rate (breaths/min): 16 Body Mass Index (BMI): 31.1 Blood Pressure (mmHg): 115/55 Reference Range: 80 - 120 mg / dl Electronic Signature(s) Signed: 01/19/2017 6:22:17 PM By: Elliot Gurney, BSN, RN, CWS, Kim RN, BSN Entered By: Elliot Gurney, BSN, RN, CWS, Kim on 01/19/2017 14:18:20

## 2017-01-21 NOTE — Progress Notes (Signed)
DOROTHYMAE, MACIVER ([id number]) Visit Report for 01/19/2017 Debridement Details Patient Name: Theresa Jensen, Theresa Jensen Date of Service: 01/19/2017 1:30 PM Medical Record Patient Account Number: [id number] Number: Treating RN: Curtis Sites [birth year] (70 y.o. Other Clinician: Date of Birth/Sex: Female) Treating Dorell Gatlin Primary Care Provider: Ruffin Pyo Provider/Extender: G Referring Provider: Ruffin Pyo Weeks in Treatment: 10 Debridement Performed for Wound #1 Right Toe Great Assessment: Performed By: Physician Maxwell Caul, MD Debridement: Debridement Severity of Tissue Pre Fat layer exposed Debridement: Pre-procedure Verification/Time Out Yes - 14:35 Taken: Start Time: 14:36 Pain Control: Other : lidocaine 4% Level: Skin/Subcutaneous Tissue Total Area Debrided (L x 0.6 (cm) x 0.5 (cm) = 0.3 (cm) W): Tissue and other Viable, Non-Viable, Fibrin/Slough, Subcutaneous material debrided: Instrument: Curette Bleeding: Minimum Hemostasis Achieved: Pressure End Time: 14:36 Procedural Pain: 0 Post Procedural Pain: 0 Response to Treatment: Procedure was tolerated well Post Debridement Measurements of Total Wound Length: (cm) 0.6 Width: (cm) 0.5 Depth: (cm) 0.4 Volume: (cm) 0.094 Character of Wound/Ulcer Post Stable Debridement: Severity of Tissue Post Debridement: Fat layer exposed Post Procedure Diagnosis Same as Pre-procedure Theresa Jensen, Theresa Jensen ([id number]) Electronic Signature(s) Signed: 01/19/2017 5:33:43 PM By: Baltazar Najjar MD Signed: 01/20/2017 4:38:09 PM By: Curtis Sites Entered By: Baltazar Najjar on 01/19/2017 15:31:59 Theresa Jensen ([id number]) -------------------------------------------------------------------------------- HPI Details Patient Name: Theresa Jensen Date of Service: 01/19/2017 1:30 PM Medical Record Patient Account Number: [id number] Number: Treating RN: Curtis Sites July 27, 1945 (70 y.o. Other  Clinician: Date of Birth/Sex: Female) Treating Evalyse Stroope Primary Care Provider: Ruffin Pyo Provider/Extender: G Referring Provider: Ruffin Pyo Weeks in Treatment: 10 History of Present Illness HPI Description: 11/10/16 patient presents for evaluation as referral from her primary care provider, Ruffin Pyo, MD who has been treating her right great toe wound. Unfortunately she has had issues with diabetic control and they are working on that. Nonetheless her hemoglobin A1c on last check which was April 2018 with 8.2. She did have an x-ray which was performed and evaluated by her primary on 09/07/16 which is commented in the note to show no bone involvement. I'll see this is good news. Mainly she has been performing wet to dry packing of the wound and on the Sep 07, 2016 encounter it was noted that her primary did pair down some of the callus surrounding the wound. However no excisional debridement of the wound margins especially where it is immediately undermining and rolled is noted. She has been on two rounds of antibiotics clindamycin and Cipro fortunately there does not appear to be any sign of infection at this point. 11/17/16; patient admitted to the clinic last week with a diabetic foot ulcer on the plantar right great toe 11/24/16; plantar aspect of her right great toe dfu Wagner grade 2. Using Prisma 12/01/16; plantar aspect of her right great toe DF U Wagner grade 2 using Prisma without real change. ABI is 1. Previous x-ray showed no bone involvement 12/08/16 on evaluation today patient's ulcer appears to be doing fairly well. She continues to have some callous buildup surrounding the ulcer region and she has some Slough noted on the surface of the wound but overall this is doing fairly well. No fevers, chills, nausea, or vomiting noted at this time. 12/16/16 on evaluation today patient appears to be doing fairly well in regard to her right great toe wound. She has been  tolerating dressing changes without complication and continues to wear her offloading shoe. Fortunately she has no overall worsening symptoms deftly no evidence of infection. She experiences no  pain. No fevers, chills, nausea, or vomiting noted at this time. 12/29/16; patient with a slitlike wound on the plantar aspect of her right great toe. She has been packing silver collagen into this wound however the area is 0.8 cm of depth medially. She is wearing a [employer] forefoot offloading boot however it sounds as though she is very active. 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 She has been using silver collagen. [place] [employer] forefoot off loader in spite of this her wound really hasn't looked too bad. She'll be here next week and then absent the next week for her equestrian event. 01/11/17; small slitlike wound on the plantar aspect of her right great toe however with considerable relative depth. I changed her to Endoform last week although her intake nurse was not sure that's what she was using. She is using a [employer] forefoot off loader however she is a very active woman and has difficulty with the idea of a total contact cast especially as it applies to driving. She does not have obvious arterial issues. Consideration will need to be given of imaging studies repeated. 01/19/17; patient has been active on her property as a result of the inclement weather. Wound actually looks some better no longer quite as slitlike periods one edge of the wound actually looked quite good well the other required debridement we have been using Theresa Jensen, Theresa Jensen ([id number]) Electronic Signature(s) Signed: 01/19/2017 5:33:43 PM By: Baltazar Najjar MD Entered By: Baltazar Najjar on 01/19/2017 15:32:52 Theresa Jensen  ([id number]) -------------------------------------------------------------------------------- Physical Exam Details Patient Name: Theresa Jensen Date of Service: 01/19/2017 1:30 PM Medical Record Patient Account Number: [id number] Number: Treating RN: Curtis Sites 1945/12/02 (70 y.o. Other Clinician: Date of Birth/Sex: Female) Treating Ronniesha Seibold Primary Care Provider: Ruffin Pyo Provider/Extender: G Referring Provider: Ruffin Pyo Weeks in Treatment: 10 Constitutional Sitting or standing Blood Pressure is within target range for patient.. Pulse regular and within target range for patient.Marland Kitchen Respirations regular, non-labored and within target range.. Temperature is normal and within the target range for the patient.Marland Kitchen appears in no distress. Notes Wound exam; in general the wound looks better. Superior surface level with the surface of the wound. Inferior surface required debridement with pickups and a scalpel to remove redundant calloused skin and subcutaneous tissue and the wound bed actually looks quite satisfactory. Minimal bleeding. Electronic Signature(s) Signed: 01/19/2017 5:33:43 PM By: Baltazar Najjar MD Entered By: Baltazar Najjar on 01/19/2017 15:34:06 Theresa Jensen ([id number]) -------------------------------------------------------------------------------- Physician Orders Details Patient Name: Theresa Jensen Date of Service: 01/19/2017 1:30 PM Medical Record Patient Account Number: [id number] Number: Treating RN: Huel Coventry 1946-04-19 (70 y.o. Other Clinician: Date of Birth/Sex: Female) Treating Lecia Esperanza Primary Care Provider: Ruffin Pyo Provider/Extender: G Referring Provider: Ruffin Pyo Weeks in Treatment: 10 Verbal / Phone Orders: No Diagnosis Coding Wound Cleansing Wound #1 Right Toe Great o Cleanse wound with mild soap and water o May Shower, gently pat wound dry prior to applying new dressing. o May  shower with protection. Anesthetic Wound #1 Right Toe Great o Topical Lidocaine 4% cream applied to wound bed prior to debridement - in clinic Primary Wound Dressing Wound #1 Right Toe Great o Other: - endoform Secondary Dressing Wound #1 Right Toe Great o Boardered Foam Dressing Dressing Change Frequency Wound #1 Right  Toe Great o Change dressing every other day. Follow-up Appointments Wound #1 Right Toe Great o Return Appointment in 1 week. Off-Loading Wound #1 Right Toe Great o Other: - ffront off-loader Additional Orders / Instructions Wound #1 Right Toe Great o Increase protein intake. Theresa Jensen, Theresa Jensen ([id number]) o Activity as tolerated Home Health Wound #1 Right Toe Doretha Sou [facility] Visits - [facility] o Home Health Nurse may visit PRN to address patientos wound care needs. o FACE TO FACE ENCOUNTER: MEDICARE and MEDICAID PATIENTS: I certify that this patient is under my care and that I had a face-to-face encounter that meets the physician face-to-face encounter requirements with this patient on this date. The encounter with the patient was in whole or in part for the following MEDICAL CONDITION: (primary reason for Home Healthcare) MEDICAL NECESSITY: I certify, that based on my findings, NURSING services are a medically necessary home health service. HOME BOUND STATUS: I certify that my clinical findings support that this patient is homebound (i.e., Due to illness or injury, pt requires aid of supportive devices such as crutches, cane, wheelchairs, walkers, the use of special transportation or the assistance of another person to leave their place of residence. There is a normal inability to leave the home and doing so requires considerable and taxing effort. Other absences are for medical reasons / religious services and are infrequent or of short duration when for other reasons). o If current dressing causes regression in wound  condition, may D/C ordered dressing product/s and apply Normal Saline Moist Dressing daily until next Wound Healing Center / Other MD appointment. Notify Wound Healing Center of regression in wound condition at [phone]. o Please direct any NON-WOUND related issues/requests for orders to patient's Primary Care Physician Electronic Signature(s) Signed: 01/19/2017 5:33:43 PM By: Baltazar Najjar MD Signed: 01/19/2017 6:22:17 PM By: Elliot Gurney, BSN, RN, CWS, Kim RN, BSN Entered By: Elliot Gurney, BSN, RN, CWS, Kim on 01/19/2017 14:35:21 Theresa Jensen ([id number]) -------------------------------------------------------------------------------- Problem List Details Patient Name: Theresa Jensen Date of Service: 01/19/2017 1:30 PM Medical Record Patient Account Number: [id number] Number: Treating RN: Curtis Sites 10-19-1945 (70 y.o. 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 E11.40 Type 2 diabetes mellitus with diabetic neuropathy, 11/11/2016 Yes unspecified Inactive Problems Resolved Problems ICD-10 Code Description Active Date Resolved Date L03.031 Cellulitis of right toe 11/11/2016 11/11/2016 Electronic Signature(s) Signed: 01/19/2017 5:33:43 PM By: Baltazar Najjar MD Entered By: Baltazar Najjar on 01/19/2017 15:31:36 Theresa Jensen ([id number]) -------------------------------------------------------------------------------- Progress Note Details Patient Name: Theresa Jensen Date of Service: 01/19/2017 1:30 PM Medical Record Patient Account Number: [id number] Number: Treating RN: Curtis Sites [birth year] (70 y.o. Other Clinician: Date of Birth/Sex: Female) Treating Sears Oran Primary Care Provider: Ruffin Pyo Provider/Extender: G Referring Provider: Ruffin Pyo Weeks in Treatment: 10 Subjective History of Present Illness (HPI) 11/10/16 patient presents for evaluation as referral from her primary care provider, Ruffin Pyo, MD who has been treating her right great toe wound. Unfortunately she has had issues with diabetic control and they are working on that. Nonetheless her hemoglobin A1c on last check which was April 2018 with 8.2. She did have an x-ray which was performed and evaluated by her primary on 09/07/16 which is commented  in the note to show no bone involvement. I'll see this is good news. Mainly she has been performing wet to dry packing of the wound and on the Sep 07, 2016 encounter it was noted that her primary did pair down some of the callus surrounding the wound. However no excisional debridement of the wound margins especially where it is immediately undermining and rolled is noted. She has been on two rounds of antibiotics clindamycin and Cipro fortunately there does not appear to be any sign of infection at this point. 11/17/16; patient admitted to the clinic last week with a diabetic foot ulcer on the plantar right great toe 11/24/16; plantar aspect of her right great toe dfu Wagner grade 2. Using Prisma 12/01/16; plantar aspect of her right great toe DF U Wagner grade 2 using Prisma without real change. ABI is 1. Previous x-ray showed no bone involvement 12/08/16 on evaluation today patient's ulcer appears to be doing fairly well. She continues to have some callous buildup surrounding the ulcer region and she has some Slough noted on the surface of the wound but overall this is doing fairly well. No fevers, chills, nausea, or vomiting noted at this time. 12/16/16 on evaluation today patient appears to be doing fairly well in regard to her right  great toe wound. She has been tolerating dressing changes without complication and continues to wear her offloading shoe. Fortunately she has no overall worsening symptoms deftly no evidence of infection. She experiences no pain. No fevers, chills, nausea, or vomiting noted at this time. 12/29/16; patient with a slitlike wound on the plantar aspect of her right great toe. She has been packing silver collagen into this wound however the area is 0.8 cm of depth medially. She is wearing a [employer] forefoot offloading boot however it sounds as though she is very active. 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 She has been using silver collagen. [place] [employer] forefoot off loader in spite of this her wound really hasn't looked too bad. She'll be here next week and then absent the next week for her equestrian event. 01/11/17; small slitlike wound on the plantar aspect of her right great toe however with considerable relative depth. I changed her to Endoform last week although her intake nurse was not sure that's what she was using. She is using a [employer] forefoot off loader however she is a very active woman and has difficulty with the idea of a total contact cast especially as it applies to driving. She does not have obvious arterial issues. Consideration will need to be given of imaging studies repeated. 01/19/17; patient has been active on her property as a result of the inclement weather. Wound actually looks some better no longer quite as slitlike periods one edge of the wound actually looked quite good well the other required debridement we have been using Endoform Westrich, Baya ([id number]) Objective Constitutional Sitting or standing Blood Pressure is within target range for patient.. Pulse regular and within target  range for patient.Marland Kitchen Respirations regular, non-labored and within target range.. Temperature is normal and within the target range for the patient.Marland Kitchen appears in no distress. Vitals Time Taken: 2:18 PM, Height: 65 in, Weight: 187 lbs, BMI: 31.1, Temperature: 98.1 F, Pulse: 98 bpm, Respiratory Rate: 16 breaths/min, Blood Pressure: 115/55  mmHg. General Notes: Wound exam; in general the wound looks better. Superior surface level with the surface of the wound. Inferior surface required debridement with pickups and a scalpel to remove redundant calloused skin and subcutaneous tissue and the wound bed actually looks quite satisfactory. Minimal bleeding. Integumentary (Hair, Skin) Wound #1 status is Open. Original cause of wound was Gradually Appeared. The wound is located on the Right Toe Great. The wound measures 0.6cm length x 0.5cm width x 0.4cm depth; 0.236cm^2 area and 0.094cm^3 volume. There is Fat Layer (Subcutaneous Tissue) Exposed exposed. There is no tunneling or undermining noted. There is a medium amount of serosanguineous drainage noted. The wound margin is distinct with the outline attached to the wound base. There is medium (34-66%) red granulation within the wound bed. There is a medium (34-66%) amount of necrotic tissue within the wound bed including Adherent Slough. The periwound skin appearance exhibited: Callus. The periwound skin appearance did not exhibit: Crepitus, Excoriation, Induration, Rash, Scarring, Dry/Scaly, Maceration, Atrophie Blanche, Cyanosis, Ecchymosis, Hemosiderin Staining, Mottled, Pallor, Rubor, Erythema. Periwound temperature was noted as No Abnormality. Assessment Active Problems ICD-10 E11.621 - Type 2 diabetes mellitus with foot ulcer L97.512 - Non-pressure chronic ulcer of other part of right foot with fat layer exposed E11.40 - Type 2 diabetes mellitus with diabetic neuropathy, unspecified Theresa Jensen, Theresa Jensen ([id number]) Procedures Wound #1 Pre-procedure  diagnosis of Wound #1 is a Diabetic Wound/Ulcer of the Lower Extremity located on the Right Toe Great .Severity of Tissue Pre Debridement is: Fat layer exposed. There was a Skin/Subcutaneous Tissue Debridement ([id number]) debridement with total area of 0.3 sq cm performed by Maxwell Caul, MD. with the following instrument(s): Curette to remove Viable and Non-Viable tissue/material including Fibrin/Slough and Subcutaneous after achieving pain control using Other (lidocaine 4%). A time out was conducted at 14:35, prior to the start of the procedure. A Minimum amount of bleeding was controlled with Pressure. The procedure was tolerated well with a pain level of 0 throughout and a pain level of 0 following the procedure. Post Debridement Measurements: 0.6cm length x 0.5cm width x 0.4cm depth; 0.094cm^3 volume. Character of Wound/Ulcer Post Debridement is stable. Severity of Tissue Post Debridement is: Fat layer exposed. Post procedure Diagnosis Wound #1: Same as Pre-Procedure Plan Wound Cleansing: Wound #1 Right Toe Great: Cleanse wound with mild soap and water May Shower, gently pat wound dry prior to applying new dressing. May shower with protection. Anesthetic: Wound #1 Right Toe Great: Topical Lidocaine 4% cream applied to wound bed prior to debridement - in clinic Primary Wound Dressing: Wound #1 Right Toe Great: Other: - endoform Secondary Dressing: Wound #1 Right Toe Great: Boardered Foam Dressing Dressing Change Frequency: Wound #1 Right Toe Great: Change dressing every other day. Follow-up Appointments: Wound #1 Right Toe Great: Return Appointment in 1 week. Off-Loading: Wound #1 Right Toe Great: Other: - ffront off-loader Additional Orders / InstructionsZOOEY, Theresa Jensen ([id number]) Wound #1 Right Toe Great: Increase protein intake. Activity as tolerated Home Health: Wound #1 Right Toe Great: Continue Home Health Visits - [facility] Nurse may visit  PRN to address patient s wound care needs. FACE TO FACE ENCOUNTER: MEDICARE and MEDICAID PATIENTS: I certify that this patient is under my care and that I had a face-to-face encounter that meets the physician face-to-face encounter requirements with this patient on this date. The encounter with the patient was in whole or in part for the following MEDICAL CONDITION: (primary reason for Home Healthcare) MEDICAL NECESSITY: I certify, that based  on my findings, NURSING services are a medically necessary home health service. HOME BOUND STATUS: I certify that my clinical findings support that this patient is homebound (i.e., Due to illness or injury, pt requires aid of supportive devices such as crutches, cane, wheelchairs, walkers, the use of special transportation or the assistance of another person to leave their place of residence. There is a normal inability to leave the home and doing so requires considerable and taxing effort. Other absences are for medical reasons / religious services and are infrequent or of short duration when for other reasons). If current dressing causes regression in wound condition, may D/C ordered dressing product/s and apply Normal Saline Moist Dressing daily until next Wound Healing Center / Other MD appointment. Notify Wound Healing Center of regression in wound condition at [phone]. Please direct any NON-WOUND related issues/requests for orders to patient's Primary Care Physician continue with endoform foam Electronic Signature(s) Signed: 01/19/2017 5:33:43 PM By: Baltazar Najjar MD Entered By: Baltazar Najjar on 01/19/2017 15:34:41 Theresa Jensen ([id number]) -------------------------------------------------------------------------------- SuperBill Details Patient Name: Theresa Jensen Date of Service: 01/19/2017 Medical Record Patient Account Number: [id number] Number: Treating RN: Curtis Sites 1945-05-18 (70 y.o. Other Clinician: Date of  Birth/Sex: Female) Treating Enola Siebers Primary Care Provider: Ruffin Pyo Provider/Extender: G Referring Provider: Ruffin Pyo Weeks in Treatment: 10 Diagnosis Coding ICD-10 Codes Code Description E11.621 Type 2 diabetes mellitus with foot ulcer L97.512 Non-pressure chronic ulcer of other part of right foot with fat layer exposed E11.40 Type 2 diabetes mellitus with diabetic neuropathy, unspecified Facility Procedures CPT4 Code Description: [id number] 11042 - DEB SUBQ TISSUE 20 SQ CM/< ICD-10 Description Diagnosis E11.621 Type 2 diabetes mellitus with foot ulcer L97.512 Non-pressure chronic ulcer of other part of right fo Modifier: ot with fat la Quantity: 1 yer exposed Physician Procedures CPT4 Code Description: [id number] 11042 - WC PHYS SUBQ TISS 20 SQ CM ICD-10 Description Diagnosis E11.621 Type 2 diabetes mellitus with foot ulcer L97.512 Non-pressure chronic ulcer of other part of right fo Modifier: ot with fat lay Quantity: 1 er exposed Electronic Signature(s) Signed: 01/19/2017 5:33:43 PM By: Baltazar Najjar MD Entered By: Baltazar Najjar on 01/19/2017 15:35:02

## 2017-01-26 ENCOUNTER — Encounter: Payer: Medicare Other | Admitting: Internal Medicine

## 2017-01-26 DIAGNOSIS — E11621 Type 2 diabetes mellitus with foot ulcer: Secondary | ICD-10-CM | POA: Diagnosis not present

## 2017-01-27 NOTE — Progress Notes (Signed)
Theresa Jensen ([id number]) Visit Report for 01/26/2017 Debridement Details Patient Name: Theresa Jensen, Theresa Jensen Date of Service: 01/26/2017 9:45 AM Medical Record Patient Account Number: [id number] Number: Treating RN: Phillis Haggis 11/13/1945 (71 y.o. Other Clinician: Date of Birth/Sex: Female) Treating Loman Logan Primary Care Provider: Ruffin Pyo Provider/Extender: G Referring Provider: Ruffin Pyo Weeks in Treatment: 11 Debridement Performed for Wound #1 Right Toe Great Assessment: Performed By: Physician Maxwell Caul, MD Debridement: Debridement Severity of Tissue Pre Fat layer exposed Debridement: Pre-procedure Verification/Time Out Yes - 10:07 Taken: Start Time: 10:08 Pain Control: Lidocaine 4% Topical Solution Level: Skin/Subcutaneous Tissue Total Area Debrided (L x 0.6 (cm) x 0.4 (cm) = 0.24 (cm) W): Tissue and other Viable, Non-Viable, Callus, Exudate, Fibrin/Slough, Subcutaneous material debrided: Instrument: Curette Bleeding: Minimum Hemostasis Achieved: Pressure End Time: 10:10 Procedural Pain: 0 Post Procedural Pain: 0 Response to Treatment: Procedure was tolerated well Post Debridement Measurements of Total Wound Length: (cm) 0.6 Width: (cm) 0.5 Depth: (cm) 0.3 Volume: (cm) 0.071 Character of Wound/Ulcer Post Requires Further Debridement Debridement: Severity of Tissue Post Debridement: Fat layer exposed Post Procedure Diagnosis Same as Pre-procedure MARIDEL, PIXLER ([id number]) Electronic Signature(s) Signed: 01/26/2017 4:25:37 PM By: Alejandro Mulling Signed: 01/26/2017 4:28:59 PM By: Baltazar Najjar MD Entered By: Baltazar Najjar on 01/26/2017 10:29:20 Theresa Jensen ([id number]) -------------------------------------------------------------------------------- HPI Details Patient Name: Theresa Jensen Date of Service: 01/26/2017 9:45 AM Medical Record Patient Account Number: [id number] Number: Treating RN:  Phillis Haggis 07-Aug-1945 (71 y.o. Other Clinician: Date of Birth/Sex: Female) Treating Kiylee Thoreson Primary Care Provider: Ruffin Pyo Provider/Extender: G Referring Provider: Ruffin Pyo Weeks in Treatment: 11 History of Present Illness HPI Description: 11/10/16 patient presents for evaluation as referral from her primary care provider, Ruffin Pyo, MD who has been treating her right great toe wound. Unfortunately she has had issues with diabetic control and they are working on that. Nonetheless her hemoglobin A1c on last check which was April 2018 with 8.2. She did have an x-ray which was performed and evaluated by her primary on 09/07/16 which is commented in the note to show no bone involvement. I'll see this is good news. Mainly she has been performing wet to dry packing of the wound and on the Sep 07, 2016 encounter it was noted that her primary did pair down some of the callus surrounding the wound. However no excisional debridement of the wound margins especially where it is immediately undermining and rolled is noted. She has been on two rounds of antibiotics clindamycin and Cipro fortunately there does not appear to be any sign of infection at this point. 11/17/16; patient admitted to the clinic last week with a diabetic foot ulcer on the plantar right great toe 11/24/16; plantar aspect of her right great toe dfu Wagner grade 2. Using Prisma 12/01/16; plantar aspect of her right great toe DF U Wagner grade 2 using Prisma without real change. ABI is 1. Previous x-ray showed no bone involvement 12/08/16 on evaluation today patient's ulcer appears to be doing fairly well. She continues to have some callous buildup surrounding the ulcer region and she has some Slough noted on the surface of the wound but overall this is doing fairly well. No fevers, chills, nausea, or vomiting noted at this time. 12/16/16 on evaluation today patient appears to be doing fairly well in regard to her  right great toe wound. She has been tolerating dressing changes without complication and continues to wear her offloading shoe. Fortunately she has no overall worsening symptoms deftly no evidence of  infection. She experiences no pain. No fevers, chills, nausea, or vomiting noted at this time. 12/29/16; patient with a slitlike wound on the plantar aspect of her right great toe. She has been packing silver collagen into this wound however the area is 0.8 cm of depth medially. She is wearing a [employer] forefoot offloading boot however it sounds as though she is very active. 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 She has been using silver collagen. [place] [employer] forefoot off loader in spite of this her wound really hasn't looked too bad. She'll be here next week and then absent the next week for her equestrian event. 01/11/17; small slitlike wound on the plantar aspect of her right great toe however with considerable relative depth. I changed her to Endoform last week although her intake nurse was not sure that's what she was using. She is using a Dar[employer] forefoot off loader however she is a very active woman and has difficulty with the idea of a total contact cast especially as it applies to driving. She does not have obvious arterial issues. Consideration will need to be given of imaging studies repeated. 01/19/17; patient has been active on her property as a result of the inclement weather. Wound actually looks some better no longer quite as slitlike periods one edge of the wound actually looked quite good well the other required debridement we have been using Endoform 01/26/17; wound is per usual. She arrives with callus, skin and subcutaneous tissue around this linear wound Plasencia, Caitlin ([id number]) bed. Reasonably easy  to debride and get to a healthy surface although we are not filling this and even with use of Endoform. I think this is a result of excessive pressure even in the Darco offloading Electronic Signature(s) Signed: 01/26/2017 4:28:59 PM By: Baltazar Najjar MD Entered By: Baltazar Najjar on 01/26/2017 10:30:23 Theresa Jensen ([id number]) -------------------------------------------------------------------------------- Physical Exam Details Patient Name: Theresa Jensen Date of Service: 01/26/2017 9:45 AM Medical Record Patient Account Number: [id number] Number: Treating RN: Phillis Haggis Apr 11, 1946 (70 y.o. Other Clinician: Date of Birth/Sex: Female) Treating Staceyann Knouff Primary Care Provider: Ruffin Pyo Provider/Extender: G Referring Provider: Ruffin Pyo Weeks in Treatment: 11 Constitutional Sitting or standing Blood Pressure is within target range for patient.. Pulse regular and within target range for patient.Marland Kitchen Respirations regular, non-labored and within target range.. Temperature is normal and within the target range for the patient.Marland Kitchen appears in no distress. Cardiovascular Pedal pulses palpable and strong bilaterally.. Notes Wound exam; perhaps minimal improvement. I talked to her this week about a total contact cast. There is no evidence of surrounding infection. Using a #3 curet callus skin subcutaneous tissue from around the wound surface. Post-debridement the lower [place] part of this wound appears to have healthy granulation Electronic Signature(s) Signed: 01/26/2017 4:28:59 PM By: Baltazar Najjar MD Entered By: Baltazar Najjar on 01/26/2017 10:31:34 Theresa Jensen ([id number]) -------------------------------------------------------------------------------- Physician Orders Details Patient Name: Theresa Jensen Date of Service: 01/26/2017 9:45 AM Medical Record Patient Account Number: [id number] Number: Treating RN: Phillis Haggis [birth year] (70 y.o. Other Clinician: Date of Birth/Sex: Female) Treating Jasen Hartstein Primary Care Provider: Ruffin Pyo Provider/Extender: G Referring Provider: Ruffin Pyo Weeks in Treatment: 11 Verbal / Phone Orders: Yes Clinician: Ashok Cordia, Debi Read Back and Verified: Yes Diagnosis Coding Wound Cleansing Wound #1  Right Toe Great o Cleanse wound with mild soap and water o May Shower, gently pat wound dry prior to applying new dressing. o May shower with protection. Anesthetic Wound #1 Right Toe Great o Topical Lidocaine 4% cream applied to wound bed prior to debridement - in clinic Primary Wound Dressing Wound #1 Right Toe Great o Other: - endoform Secondary Dressing Wound #1 Right Toe Great o Boardered Foam Dressing Dressing Change Frequency Wound #1 Right Toe Great o Change dressing every other day. Follow-up Appointments Wound #1 Right Toe Great o Return Appointment in 1 week. Off-Loading Wound #1 Right Toe Great o Other: - ffront off-loader Additional Orders / Instructions Wound #1 Right Toe Great o Increase protein intake. FELESHIA, ZUNDEL ([id number]) o Activity as tolerated Electronic Signature(s) Signed: 01/26/2017 4:25:37 PM By: Alejandro Mulling Signed: 01/26/2017 4:28:59 PM By: Baltazar Najjar MD Entered By: Alejandro Mulling on 01/26/2017 10:13:53 Theresa Jensen ([id number]) -------------------------------------------------------------------------------- Problem List Details Patient Name: Theresa Jensen Date of Service: 01/26/2017 9:45 AM Medical Record Patient Account Number: [id number] Number: Treating RN: Phillis Haggis 1945/11/14 (70 y.o. 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 E11.40 Type 2 diabetes mellitus with diabetic neuropathy, 11/11/2016 Yes unspecified Inactive Problems Resolved Problems ICD-10 Code Description Active Date Resolved Date L03.031 Cellulitis of right toe 11/11/2016 11/11/2016 Electronic Signature(s) Signed: 01/26/2017 4:28:59 PM By: Baltazar Najjar MD Entered By: Baltazar Najjar on 01/26/2017 10:28:44 Theresa Jensen ([id number]) -------------------------------------------------------------------------------- Progress Note Details Patient Name: Theresa Jensen Date of Service: 01/26/2017 9:45 AM Medical Record Patient Account Number: [id number] Number: Treating RN: Phillis Haggis 1945-11-09 (70 y.o. Other Clinician: Date of Birth/Sex: Female) Treating Jazzy Parmer Primary Care Provider: Ruffin Pyo Provider/Extender: G Referring Provider: Ruffin Pyo Weeks in Treatment: 11 Subjective History of Present Illness (HPI) 11/10/16 patient presents for evaluation as referral from her primary care provider, Ruffin Pyo, MD who has been treating her right great toe wound. Unfortunately she has had issues with diabetic control and they are working on that. Nonetheless her hemoglobin A1c on last check which was April 2018 with 8.2. She did have an x-ray which was performed and evaluated by her primary on 09/07/16 which is commented in the note to show no bone involvement. I'll see this is good news. Mainly she has been performing wet to dry packing of the wound and on the Sep 07, 2016 encounter it was noted that her primary did pair down some of the callus surrounding the wound. However no excisional debridement of the wound margins especially where it is immediately undermining and rolled is noted. She has been on two rounds of antibiotics clindamycin  and Cipro fortunately there does not appear to be any sign of infection at this point. 11/17/16; patient admitted to the clinic last week with a diabetic foot ulcer on the plantar right great toe 11/24/16; plantar aspect of her right great toe dfu Wagner grade 2. Using Prisma 12/01/16; plantar aspect of her right great toe DF U Wagner grade 2 using Prisma without real change. ABI is 1. Previous x-ray showed no bone involvement 12/08/16 on evaluation today patient's ulcer appears to be doing fairly well. She continues to have some callous buildup surrounding the ulcer region and  she has some Slough noted on the surface of the wound but overall this is doing fairly well. No fevers, chills, nausea, or vomiting noted at this time. 12/16/16 on evaluation today patient appears to be doing fairly well in regard to her right great toe wound. She has been tolerating dressing changes without complication and continues to wear her offloading shoe. Fortunately she has no overall worsening symptoms deftly no evidence of infection. She experiences no pain. No fevers, chills, nausea, or vomiting noted at this time. 12/29/16; patient with a slitlike wound on the plantar aspect of her right great toe. She has been packing silver collagen into this wound however the area is 0.8 cm of depth medially. She is wearing a [employer] forefoot offloading boot however it sounds as though she is very active. 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 She has been using silver collagen. [place] [employer] forefoot off loader in spite of this her wound really hasn't looked too bad. She'll be here next week and then absent the next week for her equestrian event. 01/11/17; small slitlike wound on the plantar aspect of her right great toe however with considerable  relative depth. I changed her to Endoform last week although her intake nurse was not sure that's what she was using. She is using a [employer] forefoot off loader however she is a very active woman and has difficulty with the idea of a total contact cast especially as it applies to driving. She does not have obvious arterial issues. Consideration will need to be given of imaging studies repeated. 01/19/17; patient has been active on her property as a result of the inclement weather. Wound actually looks some better no longer quite as slitlike periods one edge of the wound actually looked quite good well the other required debridement we have been using Endoform 01/26/17; wound is per usual. She arrives with callus, skin and subcutaneous tissue around this linear wound Trotter, Lisseth ([id number]) bed. Reasonably easy to debride and get to a healthy surface although we are not filling this and even with use of Endoform. I think this is a result of excessive pressure even in the Dar[employer] offloading Objective Constitutional Sitting or standing Blood Pressure is within target range for patient.. Pulse regular and within target range for patient.Marland Kitchen Respirations regular, non-labored and within target range.. Temperature is normal and within the target range for the patient.Marland Kitchen appears in no distress. Vitals Time Taken: 9:53 AM, Height: 65 in, Weight: 187 lbs, BMI: 31.1, Temperature: 98.3 F, Pulse: 94 bpm, Respiratory Rate: 16 breaths/min, Blood Pressure: 117/53 mmHg. Cardiovascular Pedal pulses palpable and strong bilaterally.. General Notes: Wound exam; perhaps minimal improvement. I talked to her this week about a total contact cast. There is no evidence of surrounding infection. Using a #3 curet callus skin subcutaneous tissue from around the wound surface. Post-debridement the lower [place] part of this wound appears to have healthy granulation Integumentary (Hair, Skin) Wound #1 status is Open. Original  cause of wound was Gradually Appeared. The wound is located on the Right Toe Great. The wound measures 0.6cm length x 0.4cm width x 0.2cm depth; 0.188cm^2 area and 0.038cm^3 volume. There is Fat Layer (Subcutaneous Tissue) Exposed exposed. There is no tunneling or undermining noted. There is a medium amount of serosanguineous drainage  noted. The wound margin is distinct with the outline attached to the wound base. There is medium (34-66%) red granulation within the wound bed. There is a medium (34-66%) amount of necrotic tissue within the wound bed including Adherent Slough. The periwound skin appearance exhibited: Callus. The periwound skin appearance did not exhibit: Crepitus, Excoriation, Induration, Rash, Scarring, Dry/Scaly, Maceration, Atrophie Blanche, Cyanosis, Ecchymosis, Hemosiderin Staining, Mottled, Pallor, Rubor, Erythema. Periwound temperature was noted as No Abnormality. Assessment Active Problems BRENDOLYN, STOCKLEY ([id number]) ICD-10 E11.621 - Type 2 diabetes mellitus with foot ulcer L97.512 - Non-pressure chronic ulcer of other part of right foot with fat layer exposed E11.40 - Type 2 diabetes mellitus with diabetic neuropathy, unspecified Procedures Wound #1 Pre-procedure diagnosis of Wound #1 is a Diabetic Wound/Ulcer of the Lower Extremity located on the Right Toe Great .Severity of Tissue Pre Debridement is: Fat layer exposed. There was a Skin/Subcutaneous Tissue Debridement ([id number]) debridement with total area of 0.24 sq cm performed by Maxwell Caul, MD. with the following instrument(s): Curette to remove Viable and Non-Viable tissue/material including Exudate, Fibrin/Slough, Callus, and Subcutaneous after achieving pain control using Lidocaine 4% Topical Solution. A time out was conducted at 10:07, prior to the start of the procedure. A Minimum amount of bleeding was controlled with Pressure. The procedure was tolerated well with a pain level of 0 throughout  and a pain level of 0 following the procedure. Post Debridement Measurements: 0.6cm length x 0.5cm width x 0.3cm depth; 0.071cm^3 volume. Character of Wound/Ulcer Post Debridement requires further debridement. Severity of Tissue Post Debridement is: Fat layer exposed. Post procedure Diagnosis Wound #1: Same as Pre-Procedure Plan Wound Cleansing: Wound #1 Right Toe Great: Cleanse wound with mild soap and water May Shower, gently pat wound dry prior to applying new dressing. May shower with protection. Anesthetic: Wound #1 Right Toe Great: Topical Lidocaine 4% cream applied to wound bed prior to debridement - in clinic Primary Wound Dressing: Wound #1 Right Toe Great: Other: - endoform Secondary Dressing: Wound #1 Right Toe Great: Boardered Foam Dressing Dressing Change Frequency: Wound #1 Right Toe Great: Change dressing every other day. XIARA, KNISLEY ([id number]) Follow-up Appointments: Wound #1 Right Toe Great: Return Appointment in 1 week. Off-Loading: Wound #1 Right Toe Great: Other: - ffront off-loader Additional Orders / Instructions: Wound #1 Right Toe Great: Increase protein intake. Activity as tolerated #1 I'm going to continue with the Endoform #2 I continue to think that this problematic area isn't inadequate offloading for this very active patient #3 plan for total contact cast next week Electronic Signature(s) Signed: 01/26/2017 4:28:59 PM By: Baltazar Najjar MD Entered By: Baltazar Najjar on 01/26/2017 10:34:14 Theresa Jensen ([id number]) -------------------------------------------------------------------------------- SuperBill Details Patient Name: Theresa Jensen Date of Service: 01/26/2017 Medical Record Patient Account Number: [id number] Number: Treating RN: Phillis Haggis 1946/04/04 (70 y.o. Other Clinician: Date of Birth/Sex: Female) Treating Asanti Craigo Primary Care Provider: Ruffin Pyo Provider/Extender: G Referring Provider:  Ruffin Pyo Weeks in Treatment: 11 Diagnosis Coding ICD-10 Codes Code Description E11.621 Type 2 diabetes mellitus with foot ulcer L97.512 Non-pressure chronic ulcer of other part of right foot with fat layer exposed E11.40 Type 2 diabetes mellitus with diabetic neuropathy, unspecified Facility Procedures CPT4 Code Description: 29528413 11042 - DEB SUBQ TISSUE 20 SQ CM/< ICD-10 Description Diagnosis E11.621 Type 2 diabetes mellitus with foot ulcer L97.512 Non-pressure chronic ulcer of other part of right fo Modifier: ot with fat la Quantity: 1 yer exposed Physician Procedures CPT4 Code Description: 2440102 11042 - WC PHYS SUBQ TISS  20 SQ CM ICD-10 Description Diagnosis E11.621 Type 2 diabetes mellitus with foot ulcer L97.512 Non-pressure chronic ulcer of other part of right fo Modifier: ot with fat lay Quantity: 1 er exposed Electronic Signature(s) Signed: 01/26/2017 4:28:59 PM By: Baltazar Najjar MD Entered By: Baltazar Najjar on 01/26/2017 10:34:34

## 2017-01-27 NOTE — Progress Notes (Signed)
Theresa, Jensen ([id number]) Visit Report for 01/26/2017 Arrival Information Details Patient Name: Theresa Jensen, Theresa Jensen Date of Service: 01/26/2017 9:45 AM Medical Record Patient Account Number: [id number] Number: Treating RN: Phillis Haggis 05/18/1945 (70 y.o. Other Clinician: Date of Birth/Sex: Female) Treating ROBSON, MICHAEL Primary Care Trinika Cortese: Ruffin Pyo Hazel Leveille/Extender: G Referring Yashica Sterbenz: Ruffin Pyo Weeks in Treatment: 11 Visit Information History Since Last Visit All ordered tests and consults were completed: No Patient Arrived: Ambulatory Added or deleted any medications: No Arrival Time: 09:52 Any new allergies or adverse reactions: No Accompanied By: self Had a fall or experienced change in No Transfer Assistance: None activities of daily living that may affect Patient Identification Verified: Yes risk of falls: Secondary Verification Process Yes Signs or symptoms of abuse/neglect since last No Completed: visito Patient Requires Transmission- No Hospitalized since last visit: No Based Precautions: Has Dressing in Place as Prescribed: Yes Patient Has Alerts: Yes Pain Present Now: No Patient Alerts: Patient on Blood Thinner Hgb A1C 8.2 (may) Electronic Signature(s) Signed: 01/26/2017 4:25:37 PM By: Alejandro Mulling Entered By: Alejandro Mulling on 01/26/2017 09:53:39 Theresa Jensen ([id number]) -------------------------------------------------------------------------------- Encounter Discharge Information Details Patient Name: Theresa Jensen Date of Service: 01/26/2017 9:45 AM Medical Record Patient Account Number: [id number] Number: Treating RN: Phillis Haggis 1945/06/12 (70 y.o. Other Clinician: Date of Birth/Sex: Female) Treating ROBSON, MICHAEL Primary Care Mayvis Agudelo: Ruffin Pyo Sophiana Milanese/Extender: G Referring Euna Armon: Ruffin Pyo Weeks in Treatment: 11 Encounter Discharge Information Items Discharge Pain Level:  0 Discharge Condition: Stable Ambulatory Status: Ambulatory Discharge Destination: Home Transportation: Private Auto Accompanied By: self Schedule Follow-up Appointment: Yes Medication Reconciliation completed and provided to Patient/Care No Halen Mossbarger: Provided on Clinical Summary of Care: 01/26/2017 Form Type Recipient Paper Patient DS Electronic Signature(s) Signed: 01/26/2017 4:20:16 PM By: Gwenlyn Perking Entered By: Gwenlyn Perking on 01/26/2017 10:30:59 Theresa Jensen ([id number]) -------------------------------------------------------------------------------- Lower Extremity Assessment Details Patient Name: Theresa Jensen Date of Service: 01/26/2017 9:45 AM Medical Record Patient Account Number: [id number] Number: Treating RN: Phillis Haggis Jan 24, 1946 (70 y.o. Other Clinician: Date of Birth/Sex: Female) Treating ROBSON, MICHAEL Primary Care Ivin Rosenbloom: Ruffin Pyo Seniah Lawrence/Extender: G Referring Ilka Lovick: Ruffin Pyo Weeks in Treatment: 11 Vascular Assessment Pulses: Dorsalis Pedis Palpable: [Right:Yes] Posterior Tibial Extremity colors, hair growth, and conditions: Extremity Color: [Right:Normal] Temperature of Extremity: [Right:Warm] Capillary Refill: [Right:< 3 seconds] Toe Nail Assessment Left: Right: Thick: No Discolored: No Deformed: No Improper Length and Hygiene: No Electronic Signature(s) Signed: 01/26/2017 4:25:37 PM By: Alejandro Mulling Entered By: Alejandro Mulling on 01/26/2017 10:00:24 Theresa Jensen ([id number]) -------------------------------------------------------------------------------- Multi Wound Chart Details Patient Name: Theresa Jensen Date of Service: 01/26/2017 9:45 AM Medical Record Patient Account Number: [id number] Number: Treating RN: Phillis Haggis Jun 25, 1945 (70 y.o. Other Clinician: Date of Birth/Sex: Female) Treating ROBSON, MICHAEL Primary Care Zeeva Courser: Ruffin Pyo Maylen Waltermire/Extender:  G Referring Kobie Whidby: Ruffin Pyo Weeks in Treatment: 11 Vital Signs Height(in): 65 Pulse(bpm): 94 Weight(lbs): 187 Blood Pressure 117/53 (mmHg): Body Mass Index(BMI): 31 Temperature(F): 98.3 Respiratory Rate 16 (breaths/min): Photos: [1:No Photos] [N/A:N/A] Wound Location: [1:Right Toe Great] [N/A:N/A] Wounding Event: [1:Gradually Appeared] [N/A:N/A] Primary Etiology: [1:Diabetic Wound/Ulcer of N/A the Lower Extremity] Comorbid History: [1:Anemia, Hypertension, Type II Diabetes, Osteoarthritis, Neuropathy] [N/A:N/A] Date Acquired: [1:07/06/2016] [N/A:N/A] Weeks of Treatment: [1:11] [N/A:N/A] Wound Status: [1:Open] [N/A:N/A] Measurements L x W x D 0.6x0.4x0.2 [N/A:N/A] (cm) Area (cm) : [1:0.188] [N/A:N/A] Volume (cm) : [1:0.038] [N/A:N/A] % Reduction in Area: [1:53.90%] [N/A:N/A] % Reduction in Volume: 81.40% [N/A:N/A] Classification: [1:Grade 1] [N/A:N/A] Exudate Amount: [1:Medium] [N/A:N/A] Exudate Type: [1:Serosanguineous] [N/A:N/A] Exudate Color: [1:red, brown] [N/A:N/A]  Wound Margin: [1:Distinct, outline attached N/A] Granulation Amount: [1:Medium (34-66%)] [N/A:N/A] Granulation Quality: [1:Red] [N/A:N/A] Necrotic Amount: [1:Medium (34-66%)] [N/A:N/A] Exposed Structures: [1:Fat Layer (Subcutaneous N/A Tissue) Exposed: Yes Fascia: No] Tendon: No Muscle: No Joint: No Bone: No Epithelialization: None N/A N/A Debridement: Debridement (16109- N/A N/A 11047) Pre-procedure 10:07 N/A N/A Verification/Time Out Taken: Pain Control: Lidocaine 4% Topical N/A N/A Solution Tissue Debrided: Fibrin/Slough, Exudates, N/A N/A Callus, Subcutaneous Level: Skin/Subcutaneous N/A N/A Tissue Debridement Area (sq 0.24 N/A N/A cm): Instrument: Curette N/A N/A Bleeding: Minimum N/A N/A Hemostasis Achieved: Pressure N/A N/A Procedural Pain: 0 N/A N/A Post Procedural Pain: 0 N/A N/A Debridement Treatment Procedure was tolerated N/A N/A Response: well Post Debridement  0.6x0.5x0.3 N/A N/A Measurements L x W x D (cm) Post Debridement 0.071 N/A N/A Volume: (cm) Periwound Skin Texture: Callus: Yes N/A N/A Excoriation: No Induration: No Crepitus: No Rash: No Scarring: No Periwound Skin Maceration: No N/A N/A Moisture: Dry/Scaly: No Periwound Skin Color: Atrophie Blanche: No N/A N/A Cyanosis: No Ecchymosis: No Erythema: No Hemosiderin Staining: No Mottled: No Pallor: No Rubor: No Temperature: No Abnormality N/A N/A Tenderness on No N/A N/A Palpation: Wound Preparation: N/A N/A ROXANNA, MCEVER ([id number]) Ulcer Cleansing: Rinsed/Irrigated with Saline Topical Anesthetic Applied: Other: lidocaine 4% Procedures Performed: Debridement N/A N/A Treatment Notes Electronic Signature(s) Signed: 01/26/2017 4:28:59 PM By: Baltazar Najjar MD Entered By: Baltazar Najjar on 01/26/2017 10:29:05 Theresa Jensen ([id number]) -------------------------------------------------------------------------------- Multi-Disciplinary Care Plan Details Patient Name: Theresa Jensen Date of Service: 01/26/2017 9:45 AM Medical Record Patient Account Number: [id number] Number: Treating RN: Phillis Haggis 09-Apr-1946 (70 y.o. Other Clinician: Date of Birth/Sex: Female) Treating ROBSON, MICHAEL Primary Care Ileah Falkenstein: Ruffin Pyo Jayveon Convey/Extender: G Referring Lekia Nier: Ruffin Pyo Weeks in Treatment: 11 Active Inactive ` Orientation to the Wound Care Program Nursing Diagnoses: Knowledge deficit related to the wound healing center program Goals: Patient/caregiver will verbalize understanding of the Wound Healing Center Program Date Initiated: 11/10/2016 Target Resolution Date: 03/13/2017 Goal Status: Active Interventions: Provide education on orientation to the wound center Notes: ` Peripheral Neuropathy Nursing Diagnoses: Knowledge deficit related to disease process and management of peripheral neurovascular dysfunction Potential  alteration in peripheral tissue perfusion (select prior to confirmation of diagnosis) Goals: Patient/caregiver will verbalize understanding of disease process and disease management Date Initiated: 11/10/2016 Target Resolution Date: 03/13/2017 Goal Status: Active Interventions: Assess signs and symptoms of neuropathy upon admission and as needed Provide education on Management of Neuropathy and Related Ulcers Provide education on Management of Neuropathy upon discharge from the Wound Center Notes: RYIN, SCHILLO ([id number]) Pressure Nursing Diagnoses: Knowledge deficit related to management of pressures ulcers Potential for impaired tissue integrity related to pressure, friction, moisture, and shear Goals: Patient will remain free from development of additional pressure ulcers Date Initiated: 11/10/2016 Target Resolution Date: 03/13/2017 Goal Status: Active Patient will remain free of pressure ulcers Date Initiated: 11/10/2016 Target Resolution Date: 03/13/2017 Goal Status: Active Patient/caregiver will verbalize risk factors for pressure ulcer development Date Initiated: 11/10/2016 Target Resolution Date: 03/13/2017 Goal Status: Active Patient/caregiver will verbalize understanding of pressure ulcer management Date Initiated: 11/10/2016 Target Resolution Date: 03/13/2017 Goal Status: Active Interventions: Assess: immobility, friction, shearing, incontinence upon admission and as needed Assess offloading mechanisms upon admission and as needed Assess potential for pressure ulcer upon admission and as needed Provide education on pressure ulcers Treatment Activities: Patient referred for home evaluation of offloading devices/mattresses : 11/10/2016 Patient referred for pressure reduction/relief devices : 11/10/2016 Notes: ` Wound/Skin Impairment Nursing Diagnoses: Impaired tissue integrity Knowledge deficit related  to ulceration/compromised skin  integrity Goals: Patient/caregiver will verbalize understanding of skin care regimen Date Initiated: 11/10/2016 Target Resolution Date: 03/13/2017 Goal Status: Active Ulcer/skin breakdown will have a volume reduction of 30% by week 4 Date Initiated: 11/10/2016 Target Resolution Date: 03/13/2017 Goal Status: Active CHERIL, SLATTERY ([id number]) Ulcer/skin breakdown will have a volume reduction of 50% by week 8 Date Initiated: 11/10/2016 Target Resolution Date: 03/13/2017 Goal Status: Active Ulcer/skin breakdown will have a volume reduction of 80% by week 12 Date Initiated: 11/10/2016 Target Resolution Date: 03/13/2017 Goal Status: Active Ulcer/skin breakdown will heal within 14 weeks Date Initiated: 11/10/2016 Target Resolution Date: 03/13/2017 Goal Status: Active Interventions: Assess patient/caregiver ability to obtain necessary supplies Assess patient/caregiver ability to perform ulcer/skin care regimen upon admission and as needed Assess ulceration(s) every visit Provide education on ulcer and skin care Treatment Activities: Referred to DME Dago Jungwirth for dressing supplies : 11/10/2016 Skin care regimen initiated : 11/10/2016 Topical wound management initiated : 11/10/2016 Notes: Electronic Signature(s) Signed: 01/26/2017 4:25:37 PM By: Alejandro Mulling Entered By: Alejandro Mulling on 01/26/2017 10:04:34 Theresa Jensen ([id number]) -------------------------------------------------------------------------------- Pain Assessment Details Patient Name: Theresa Jensen Date of Service: 01/26/2017 9:45 AM Medical Record Patient Account Number: [id number] Number: Treating RN: Phillis Haggis 07-May-1945 (70 y.o. Other Clinician: Date of Birth/Sex: Female) Treating ROBSON, MICHAEL Primary Care Johnnie Moten: Ruffin Pyo Jaleiyah Alas/Extender: G Referring Waldo Damian: Ruffin Pyo Weeks in Treatment: 11 Active Problems Location of Pain Severity and Description of Pain Patient Has  Paino No Site Locations Pain Management and Medication Current Pain Management: Electronic Signature(s) Signed: 01/26/2017 4:25:37 PM By: Alejandro Mulling Entered By: Alejandro Mulling on 01/26/2017 09:53:44 Coltrain, [name] ([id number]) -------------------------------------------------------------------------------- Patient/Caregiver Education Details Patient Name: Theresa Jensen Date of Service: 01/26/2017 9:45 AM Medical Record Patient Account Number: [id number] Number: Treating RN: Phillis Haggis 11-26-1945 (70 y.o. Other Clinician: Date of Birth/Gender: Female) Treating ROBSON, MICHAEL Primary Care Physician/Extender: Milana Kidney, REBECCA Physician: Tania Ade in Treatment: 11 Referring Physician: Ruffin Pyo Education Assessment Education Provided To: Patient Education Topics Provided Wound/Skin Impairment: Handouts: Other: change dressing as ordered Methods: Demonstration, Explain/Verbal Responses: State content correctly Electronic Signature(s) Signed: 01/26/2017 4:25:37 PM By: Alejandro Mulling Entered By: Alejandro Mulling on 01/26/2017 10:05:24 Theresa Jensen ([id number]) -------------------------------------------------------------------------------- Wound Assessment Details Patient Name: Theresa Jensen Date of Service: 01/26/2017 9:45 AM Medical Record Patient Account Number: [id number] Number: Treating RN: Phillis Haggis [birth year] (70 y.o. Other Clinician: Date of Birth/Sex: Female) Treating ROBSON, MICHAEL Primary Care Keijuan Schellhase: Ruffin Pyo Rolena Knutson/Extender: G Referring Kaylub Detienne: Ruffin Pyo Weeks in Treatment: 11 Wound Status Wound Number: 1 Primary Diabetic Wound/Ulcer of the Lower Etiology: Extremity Wound Location: Right Toe Great Wound Open Wounding Event: Gradually Appeared Status: Date Acquired: 07/06/2016 Comorbid Anemia, Hypertension, Type II Weeks Of Treatment: 11 History: Diabetes, Osteoarthritis,  Neuropathy Clustered Wound: No Photos Photo Uploaded By: Alejandro Mulling on 01/26/2017 11:17:26 Wound Measurements Length: (cm) 0.6 Width: (cm) 0.4 Depth: (cm) 0.2 Area: (cm) 0.188 Volume: (cm) 0.038 % Reduction in Area: 53.9% % Reduction in Volume: 81.4% Epithelialization: None Tunneling: No Undermining: No Wound Description Classification: Grade 1 Foul Odor Aft Wound Margin: Distinct, outline attached Slough/Fibrin Exudate Amount: Medium Exudate Type: Serosanguineous Exudate Color: red, brown er Cleansing: No o Yes Wound Bed Granulation Amount: Medium (34-66%) Exposed Structure Granulation Quality: Red Fascia Exposed: No Necrotic Amount: Medium (34-66%) Fat Layer (Subcutaneous Tissue) Exposed: Yes Tokarczyk, [name] ([id number]) Necrotic Quality: Adherent Slough Tendon Exposed: No Muscle Exposed: No Joint Exposed: No Bone Exposed: No Periwound Skin Texture Texture Color No Abnormalities Noted: No No Abnormalities  Noted: No Callus: Yes Atrophie Blanche: No Crepitus: No Cyanosis: No Excoriation: No Ecchymosis: No Induration: No Erythema: No Rash: No Hemosiderin Staining: No Scarring: No Mottled: No Pallor: No Moisture Rubor: No No Abnormalities Noted: No Dry / Scaly: No Temperature / Pain Maceration: No Temperature: No Abnormality Wound Preparation Ulcer Cleansing: Rinsed/Irrigated with Saline Topical Anesthetic Applied: Other: lidocaine 4%, Treatment Notes Wound #1 (Right Toe Great) 1. Cleansed with: Clean wound with Normal Saline 2. Anesthetic Topical Lidocaine 4% cream to wound bed prior to debridement 4. Dressing Applied: Other dressing (specify in notes) 5. Secondary Dressing Applied Dry Gauze Foam Kerlix/Conform 7. Secured with Tape Notes Endoform; coverlet, front offload Electronic Signature(s) Signed: 01/26/2017 4:25:37 PM By: Alejandro Mulling Entered By: Alejandro Mulling on 01/26/2017 10:04:15 Theresa Jensen  ([id number]) -------------------------------------------------------------------------------- Vitals Details Patient Name: Theresa Jensen Date of Service: 01/26/2017 9:45 AM Medical Record Patient Account Number: [id number] Number: Treating RN: Phillis Haggis [birth year] (70 y.o. Other Clinician: Date of Birth/Sex: Female) Treating ROBSON, MICHAEL Primary Care Lakera Viall: Ruffin Pyo Pharrell Ledford/Extender: G Referring Ailsa Mireles: Ruffin Pyo Weeks in Treatment: 11 Vital Signs Time Taken: 09:53 Temperature (F): 98.3 Height (in): 65 Pulse (bpm): 94 Weight (lbs): 187 Respiratory Rate (breaths/min): 16 Body Mass Index (BMI): 31.1 Blood Pressure (mmHg): 117/53 Reference Range: 80 - 120 mg / dl Electronic Signature(s) Signed: 01/26/2017 4:25:37 PM By: Alejandro Mulling Entered By: Alejandro Mulling on 01/26/2017 09:55:14

## 2017-02-01 ENCOUNTER — Encounter: Payer: Medicare Other | Attending: Internal Medicine | Admitting: Internal Medicine

## 2017-02-01 DIAGNOSIS — E1140 Type 2 diabetes mellitus with diabetic neuropathy, unspecified: Secondary | ICD-10-CM | POA: Insufficient documentation

## 2017-02-01 DIAGNOSIS — L97512 Non-pressure chronic ulcer of other part of right foot with fat layer exposed: Secondary | ICD-10-CM | POA: Insufficient documentation

## 2017-02-01 DIAGNOSIS — Z7984 Long term (current) use of oral hypoglycemic drugs: Secondary | ICD-10-CM | POA: Insufficient documentation

## 2017-02-01 DIAGNOSIS — Z87891 Personal history of nicotine dependence: Secondary | ICD-10-CM | POA: Diagnosis not present

## 2017-02-01 DIAGNOSIS — I10 Essential (primary) hypertension: Secondary | ICD-10-CM | POA: Diagnosis not present

## 2017-02-01 DIAGNOSIS — M1990 Unspecified osteoarthritis, unspecified site: Secondary | ICD-10-CM | POA: Insufficient documentation

## 2017-02-01 DIAGNOSIS — E11621 Type 2 diabetes mellitus with foot ulcer: Secondary | ICD-10-CM | POA: Insufficient documentation

## 2017-02-03 NOTE — Progress Notes (Signed)
ASHIRA, KIRSTEN ([id number]) Visit Report for 02/01/2017 Arrival Information Details Patient Name: Theresa Jensen, Theresa Jensen Date of Service: 02/01/2017 2:45 PM Medical Record Patient Account Number: [id number] Number: Treating RN: Huel Coventry August 06, 1945 (70 y.o. Other Clinician: Date of Birth/Sex: Female) Treating ROBSON, MICHAEL Primary Care Yameli Delamater: Ruffin Pyo Kery Haltiwanger/Extender: G Referring Tayja Manzer: Ruffin Pyo Weeks in Treatment: 11 Visit Information History Since Last Visit Added or deleted any medications: No Patient Arrived: Ambulatory Any new allergies or adverse reactions: No Arrival Time: 14:37 Had a fall or experienced change in No Accompanied By: self activities of daily living that may affect Transfer Assistance: None risk of falls: Patient Identification Verified: Yes Signs or symptoms of abuse/neglect since last No Secondary Verification Process Yes visito Completed: Hospitalized since last visit: No Patient Requires Transmission- No Has Dressing in Place as Prescribed: Yes Based Precautions: Has Footwear/Offloading in Place as Yes Patient Has Alerts: Yes Prescribed: Patient Alerts: Patient on Blood Right: Wedge Thinner Shoe Hgb A1C 8.2 Pain Present Now: No (may) Electronic Signature(s) Signed: 02/02/2017 4:42:06 PM By: Elliot Gurney, BSN, RN, CWS, Kim RN, BSN Entered By: Elliot Gurney, BSN, RN, CWS, Kim on 02/01/2017 14:42:31 Theresa Jensen ([id number]) -------------------------------------------------------------------------------- Encounter Discharge Information Details Patient Name: Theresa Jensen Date of Service: 02/01/2017 2:45 PM Medical Record Patient Account Number: [id number] Number: Treating RN: Huel Coventry January 27, 1946 (70 y.o. Other Clinician: Date of Birth/Sex: Female) Treating ROBSON, MICHAEL Primary Care Susette Seminara: Ruffin Pyo Broox Lonigro/Extender: G Referring Katrenia Alkins: Ruffin Pyo Weeks in Treatment: 11 Encounter Discharge  Information Items Schedule Follow-up Appointment: No Medication Reconciliation completed and No provided to Patient/Care Aaliyan Brinkmeier: Patient Clinical Summary of Care: Declined Electronic Signature(s) Signed: 02/02/2017 2:27:26 PM By: Gwenlyn Perking Entered By: Gwenlyn Perking on 02/01/2017 15:36:31 Theresa Jensen ([id number]) -------------------------------------------------------------------------------- Lower Extremity Assessment Details Patient Name: Theresa Jensen Date of Service: 02/01/2017 2:45 PM Medical Record Patient Account Number: [id number] Number: Treating RN: Huel Coventry June 05, 1945 (70 y.o. Other Clinician: Date of Birth/Sex: Female) Treating ROBSON, MICHAEL Primary Care Izetta Sakamoto: Ruffin Pyo Arizona Nordquist/Extender: G Referring Andrya Roppolo: Ruffin Pyo Weeks in Treatment: 11 Vascular Assessment Pulses: Dorsalis Pedis Palpable: [Right:Yes] Posterior Tibial Extremity colors, hair growth, and conditions: Extremity Color: [Right:Normal] Hair Growth on Extremity: [Right:No] Temperature of Extremity: [Right:Warm] Capillary Refill: [Right:< 3 seconds] Toe Nail Assessment Left: Right: Thick: No Discolored: No Deformed: No Improper Length and Hygiene: No Electronic Signature(s) Signed: 02/02/2017 4:42:06 PM By: Elliot Gurney, BSN, RN, CWS, Kim RN, BSN Entered By: Elliot Gurney, BSN, RN, CWS, Kim on 02/01/2017 14:47:50 Theresa Jensen ([id number]) -------------------------------------------------------------------------------- Multi Wound Chart Details Patient Name: Theresa Jensen Date of Service: 02/01/2017 2:45 PM Medical Record Patient Account Number: [id number] Number: Treating RN: Huel Coventry 1945-09-02 (70 y.o. Other Clinician: Date of Birth/Sex: Female) Treating ROBSON, MICHAEL Primary Care Arlis Everly: Ruffin Pyo Eris Breck/Extender: G Referring Zynia Wojtowicz: Ruffin Pyo Weeks in Treatment: 11 Vital Signs Height(in): 65 Pulse(bpm): 97 Weight(lbs): 187  Blood Pressure 112/52 (mmHg): Body Mass Index(BMI): 31 Temperature(F): 98.2 Respiratory Rate 16 (breaths/min): Photos: [N/A:N/A] Wound Location: Right Toe Great N/A N/A Wounding Event: Gradually Appeared N/A N/A Primary Etiology: Diabetic Wound/Ulcer of N/A N/A the Lower Extremity Comorbid History: Anemia, Hypertension, N/A N/A Type II Diabetes, Osteoarthritis, Neuropathy Date Acquired: 07/06/2016 N/A N/A Weeks of Treatment: 11 N/A N/A Wound Status: Open N/A N/A Measurements L x W x D 0.9x0.3x0.5 N/A N/A (cm) Area (cm) : 0.212 N/A N/A Volume (cm) : 0.106 N/A N/A % Reduction in Area: 48.00% N/A N/A % Reduction in Volume: 48.00% N/A N/A Classification: Grade 1 N/A N/A Exudate Amount: Medium N/A N/A  Exudate Type: Serosanguineous N/A N/A Exudate Color: red, brown N/A N/A Wound Margin: Distinct, outline attached N/A N/A Granulation Amount: Medium (34-66%) N/A N/A Stifter, [name] ([id number]) Granulation Quality: Red N/A N/A Necrotic Amount: Medium (34-66%) N/A N/A Exposed Structures: Fat Layer (Subcutaneous N/A N/A Tissue) Exposed: Yes Fascia: No Tendon: No Muscle: No Joint: No Bone: No Epithelialization: None N/A N/A Periwound Skin Texture: Callus: Yes N/A N/A Excoriation: No Induration: No Crepitus: No Rash: No Scarring: No Periwound Skin Maceration: No N/A N/A Moisture: Dry/Scaly: No Periwound Skin Color: Atrophie Blanche: No N/A N/A Cyanosis: No Ecchymosis: No Erythema: No Hemosiderin Staining: No Mottled: No Pallor: No Rubor: No Temperature: No Abnormality N/A N/A Tenderness on No N/A N/A Palpation: Wound Preparation: Ulcer Cleansing: N/A N/A Rinsed/Irrigated with Saline Topical Anesthetic Applied: Other: lidocaine 4% Treatment Notes Electronic Signature(s) Signed: 02/02/2017 8:40:51 AM By: Baltazar Najjar MD Entered By: Baltazar Najjar on 02/01/2017 16:24:46 Theresa Jensen  ([id number]) -------------------------------------------------------------------------------- Multi-Disciplinary Care Plan Details Patient Name: Theresa Jensen Date of Service: 02/01/2017 2:45 PM Medical Record Patient Account Number: [id number] Number: Treating RN: Huel Coventry April 05, 1946 (70 y.o. Other Clinician: Date of Birth/Sex: Female) Treating ROBSON, MICHAEL Primary Care Floriene Jeschke: Ruffin Pyo Tyannah Sane/Extender: G Referring Zale Marcotte: Ruffin Pyo Weeks in Treatment: 11 Active Inactive ` Orientation to the Wound Care Program Nursing Diagnoses: Knowledge deficit related to the wound healing center program Goals: Patient/caregiver will verbalize understanding of the Wound Healing Center Program Date Initiated: 11/10/2016 Target Resolution Date: 03/13/2017 Goal Status: Active Interventions: Provide education on orientation to the wound center Notes: ` Peripheral Neuropathy Nursing Diagnoses: Knowledge deficit related to disease process and management of peripheral neurovascular dysfunction Potential alteration in peripheral tissue perfusion (select prior to confirmation of diagnosis) Goals: Patient/caregiver will verbalize understanding of disease process and disease management Date Initiated: 11/10/2016 Target Resolution Date: 03/13/2017 Goal Status: Active Interventions: Assess signs and symptoms of neuropathy upon admission and as needed Provide education on Management of Neuropathy and Related Ulcers Provide education on Management of Neuropathy upon discharge from the Wound Center Notes: KEIGAN, GIRTEN ([id number]) Pressure Nursing Diagnoses: Knowledge deficit related to management of pressures ulcers Potential for impaired tissue integrity related to pressure, friction, moisture, and shear Goals: Patient will remain free from development of additional pressure ulcers Date Initiated: 11/10/2016 Target Resolution Date: 03/13/2017 Goal Status:  Active Patient will remain free of pressure ulcers Date Initiated: 11/10/2016 Target Resolution Date: 03/13/2017 Goal Status: Active Patient/caregiver will verbalize risk factors for pressure ulcer development Date Initiated: 11/10/2016 Target Resolution Date: 03/13/2017 Goal Status: Active Patient/caregiver will verbalize understanding of pressure ulcer management Date Initiated: 11/10/2016 Target Resolution Date: 03/13/2017 Goal Status: Active Interventions: Assess: immobility, friction, shearing, incontinence upon admission and as needed Assess offloading mechanisms upon admission and as needed Assess potential for pressure ulcer upon admission and as needed Provide education on pressure ulcers Treatment Activities: Patient referred for home evaluation of offloading devices/mattresses : 11/10/2016 Patient referred for pressure reduction/relief devices : 11/10/2016 Notes: ` Wound/Skin Impairment Nursing Diagnoses: Impaired tissue integrity Knowledge deficit related to ulceration/compromised skin integrity Goals: Patient/caregiver will verbalize understanding of skin care regimen Date Initiated: 11/10/2016 Target Resolution Date: 03/13/2017 Goal Status: Active Ulcer/skin breakdown will have a volume reduction of 30% by week 4 Date Initiated: 11/10/2016 Target Resolution Date: 03/13/2017 Goal Status: SHERMIKA, BALTHASER ([id number]) Ulcer/skin breakdown will have a volume reduction of 50% by week 8 Date Initiated: 11/10/2016 Target Resolution Date: 03/13/2017 Goal Status: Active Ulcer/skin breakdown will have a volume reduction of 80% by week 12  Date Initiated: 11/10/2016 Target Resolution Date: 03/13/2017 Goal Status: Active Ulcer/skin breakdown will heal within 14 weeks Date Initiated: 11/10/2016 Target Resolution Date: 03/13/2017 Goal Status: Active Interventions: Assess patient/caregiver ability to obtain necessary supplies Assess patient/caregiver ability to perform  ulcer/skin care regimen upon admission and as needed Assess ulceration(s) every visit Provide education on ulcer and skin care Treatment Activities: Referred to DME Taimane Stimmel for dressing supplies : 11/10/2016 Skin care regimen initiated : 11/10/2016 Topical wound management initiated : 11/10/2016 Notes: Electronic Signature(s) Signed: 02/02/2017 4:42:06 PM By: Elliot Gurney, BSN, RN, CWS, Kim RN, BSN Entered By: Elliot Gurney, BSN, RN, CWS, Kim on 02/01/2017 14:55:39 Theresa Jensen ([id number]) -------------------------------------------------------------------------------- Pain Assessment Details Patient Name: Theresa Jensen Date of Service: 02/01/2017 2:45 PM Medical Record Patient Account Number: [id number] Number: Treating RN: Huel Coventry Dec 16, 1945 (70 y.o. Other Clinician: Date of Birth/Sex: Female) Treating ROBSON, MICHAEL Primary Care Avice Funchess: Ruffin Pyo Yichen Gilardi/Extender: G Referring Emelin Dascenzo: Ruffin Pyo Weeks in Treatment: 11 Active Problems Location of Pain Severity and Description of Pain Patient Has Paino No Site Locations With Dressing Change: No Pain Management and Medication Current Pain Management: Electronic Signature(s) Signed: 02/02/2017 4:42:06 PM By: Elliot Gurney, BSN, RN, CWS, Kim RN, BSN Entered By: Elliot Gurney, BSN, RN, CWS, Kim on 02/01/2017 14:42:38 Theresa Jensen ([id number]) -------------------------------------------------------------------------------- Wound Assessment Details Patient Name: Theresa Jensen Date of Service: 02/01/2017 2:45 PM Medical Record Patient Account Number: [id number] Number: Treating RN: Huel Coventry Sep 10, 1945 (70 y.o. Other Clinician: Date of Birth/Sex: Female) Treating ROBSON, MICHAEL Primary Care Kern Gingras: Ruffin Pyo Lennis Korb/Extender: G Referring Tennelle Taflinger: Ruffin Pyo Weeks in Treatment: 11 Wound Status Wound Number: 1 Primary Diabetic Wound/Ulcer of the Lower Etiology: Extremity Wound Location: Right Toe  Great Wound Open Wounding Event: Gradually Appeared Status: Date Acquired: 07/06/2016 Comorbid Anemia, Hypertension, Type II Weeks Of Treatment: 11 History: Diabetes, Osteoarthritis, Neuropathy Clustered Wound: No Photos Wound Measurements Length: (cm) 0.9 Width: (cm) 0.3 Depth: (cm) 0.5 Area: (cm) 0.212 Volume: (cm) 0.106 % Reduction in Area: 48% % Reduction in Volume: 48% Epithelialization: None Tunneling: No Undermining: No Wound Description Classification: Grade 1 Foul Odor Aft Wound Margin: Distinct, outline attached Slough/Fibrin Exudate Amount: Medium Exudate Type: Serosanguineous Exudate Color: red, brown er Cleansing: No o Yes Wound Bed Granulation Amount: Medium (34-66%) Exposed Structure Granulation Quality: Red Fascia Exposed: No Necrotic Amount: Medium (34-66%) Fat Layer (Subcutaneous Tissue) Exposed: Yes Necrotic Quality: Adherent Slough Tendon Exposed: No Muscle Exposed: No Joint Exposed: No Swarthout, Darnesha ([id number]) Bone Exposed: No Periwound Skin Texture Texture Color No Abnormalities Noted: No No Abnormalities Noted: No Callus: Yes Atrophie Blanche: No Crepitus: No Cyanosis: No Excoriation: No Ecchymosis: No Induration: No Erythema: No Rash: No Hemosiderin Staining: No Scarring: No Mottled: No Pallor: No Moisture Rubor: No No Abnormalities Noted: No Dry / Scaly: No Temperature / Pain Maceration: No Temperature: No Abnormality Wound Preparation Ulcer Cleansing: Rinsed/Irrigated with Saline Topical Anesthetic Applied: Other: lidocaine 4%, Electronic Signature(s) Signed: 02/02/2017 4:42:06 PM By: Elliot Gurney, BSN, RN, CWS, Kim RN, BSN Entered By: Elliot Gurney, BSN, RN, CWS, Kim on 02/01/2017 14:47:22 Theresa Jensen ([id number]) -------------------------------------------------------------------------------- Vitals Details Patient Name: Theresa Jensen Date of Service: 02/01/2017 2:45 PM Medical Record Patient Account Number:  [id number] Number: Treating RN: Huel Coventry 08-Aug-1945 (70 y.o. Other Clinician: Date of Birth/Sex: Female) Treating ROBSON, MICHAEL Primary Care Alayziah Tangeman: Ruffin Pyo Haizley Cannella/Extender: G Referring Chrisma Hurlock: Ruffin Pyo Weeks in Treatment: 11 Vital Signs Time Taken: 14:42 Temperature (F): 98.2 Height (in): 65 Pulse (bpm): 97 Weight (lbs): 187 Respiratory Rate (breaths/min): 16 Body Mass Index (BMI):  31.1 Blood Pressure (mmHg): 112/52 Reference Range: 80 - 120 mg / dl Electronic Signature(s) Signed: 02/02/2017 4:42:06 PM By: Elliot Gurney, BSN, RN, CWS, Kim RN, BSN Entered By: Elliot Gurney, BSN, RN, CWS, Kim on 02/01/2017 14:42:55

## 2017-02-03 NOTE — Progress Notes (Addendum)
JESSEL, GETTINGER ([id number]) Visit Report for 02/01/2017 HPI Details Patient Name: Theresa Jensen, Theresa Jensen Date of Service: 02/01/2017 2:45 PM Medical Record Patient Account Number: [id number] Number: Treating RN: Huel Coventry 1945-08-13 (70 y.o. Other Clinician: Date of Birth/Sex: Female) Treating ROBSON, MICHAEL Primary Care Provider: Ruffin Pyo Provider/Extender: G Referring Provider: Ruffin Pyo Weeks in Treatment: 11 History of Present Illness HPI Description: 11/10/16 patient presents for evaluation as referral from her primary care provider, Ruffin Pyo, MD who has been treating her right great toe wound. Unfortunately she has had issues with diabetic control and they are working on that. Nonetheless her hemoglobin A1c on last check which was April 2018 with 8.2. She did have an x-ray which was performed and evaluated by her primary on 09/07/16 which is commented in the note to show no bone involvement. I'll see this is good news. Mainly she has been performing wet to dry packing of the wound and on the Sep 07, 2016 encounter it was noted that her primary did pair down some of the callus surrounding the wound. However no excisional debridement of the wound margins especially where it is immediately undermining and rolled is noted. She has been on two rounds of antibiotics clindamycin and Cipro fortunately there does not appear to be any sign of infection at this point. 11/17/16; patient admitted to the clinic last week with a diabetic foot ulcer on the plantar right great toe 11/24/16; plantar aspect of her right great toe dfu Wagner grade 2. Using Prisma 12/01/16; plantar aspect of her right great toe DF U Wagner grade 2 using Prisma without real change. ABI is 1. Previous x-ray showed no bone involvement 12/08/16 on evaluation today patient's ulcer appears to be doing fairly well. She continues to have some callous buildup surrounding the ulcer region and she has some Slough noted  on the surface of the wound but overall this is doing fairly well. No fevers, chills, nausea, or vomiting noted at this time. 12/16/16 on evaluation today patient appears to be doing fairly well in regard to her right great toe wound. She has been tolerating dressing changes without complication and continues to wear her offloading shoe. Fortunately she has no overall worsening symptoms deftly no evidence of infection. She experiences no pain. No fevers, chills, nausea, or vomiting noted at this time. 12/29/16; patient with a slitlike wound on the plantar aspect of her right great toe. She has been packing silver collagen into this wound however the area is 0.8 cm of depth medially. She is wearing a [employer] forefoot offloading boot however it sounds as though she is very active. 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 She has been using silver collagen. [place] [employer] forefoot off loader in spite of this her wound really hasn't looked too bad. She'll be here next week and then absent the next week for her equestrian event. 01/11/17; small slitlike wound on the plantar aspect of her right great toe however with considerable relative depth. I changed her to Endoform last week although her intake nurse was not sure that's what she was using. She is using a [employer] forefoot off loader however she is a very active woman and has difficulty with the idea of a total contact cast especially as it applies to driving. She does  not have obvious arterial issues. Consideration will need to be given of imaging studies repeated. 01/19/17; patient has been active on her property as a result of the inclement weather. Wound actually looks [place], Theresa Jensen ([id number]) some better no longer quite as slitlike periods one edge of the wound actually  looked quite good well the other required debridement we have been using Endoform 01/26/17; wound is per usual. She arrives with callus, skin and subcutaneous tissue around this linear wound bed. Reasonably easy to debride and get to a healthy surface although we are not filling this and even with use of Endoform. I think this is a result of excessive pressure even in the Darco offloading 02/01/17; wound looks much as usual. I elected not to debride this today we put her in a total contact cast with Endoform. She tells me I have 2 weeks to get this to close Electronic Signature(s) Signed: 02/02/2017 8:40:51 AM By: Baltazar Najjar MD Entered By: Baltazar Najjar on 02/01/2017 16:25:42 Theresa Jensen ([id number]) -------------------------------------------------------------------------------- Physical Exam Details Patient Name: Theresa Jensen Date of Service: 02/01/2017 2:45 PM Medical Record Patient Account Number: [id number] Number: Treating RN: Huel Coventry 04/06/1946 (70 y.o. Other Clinician: Date of Birth/Sex: Female) Treating ROBSON, MICHAEL Primary Care Provider: Ruffin Pyo Provider/Extender: G Referring Provider: Ruffin Pyo Weeks in Treatment: 11 Constitutional . Supine Blood Pressure is within target range for patient.. Pulse regular and within target range for patient.Marland Kitchen Respirations regular, non-labored and within target range.. Temperature is normal and within the target range for the patient.Marland Kitchen appears in no distress. Cardiovascular . Notes Wound exam; same unusual pattern to this linear wound on the plantar toe. I did not debridement this today preferring to offload this for a week before I have another look at this. Continue Endoform to the linear slitlike wound opening Electronic Signature(s) Signed: 02/02/2017 8:40:51 AM By: Baltazar Najjar MD Entered By: Baltazar Najjar on 02/01/2017 08:65:78 Theresa Jensen  ([id number]) -------------------------------------------------------------------------------- Physician Orders Details Patient Name: Theresa Jensen Date of Service: 02/01/2017 2:45 PM Medical Record Patient Account Number: [id number] Number: Treating RN: Huel Coventry Apr 16, 1946 (70 y.o. Other Clinician: Date of Birth/Sex: Female) Treating ROBSON, MICHAEL Primary Care Provider: Ruffin Pyo Provider/Extender: G Referring Provider: Ruffin Pyo Weeks in Treatment: 11 Verbal / Phone Orders: No Diagnosis Coding ICD-10 Coding Code Description E11.621 Type 2 diabetes mellitus with foot ulcer L97.512 Non-pressure chronic ulcer of other part of right foot with fat layer exposed E11.40 Type 2 diabetes mellitus with diabetic neuropathy, unspecified Wound Cleansing Wound #1 Right Toe Great o Cleanse wound with mild soap and water o May Shower, gently pat wound dry prior to applying new dressing. o May shower with protection. Anesthetic Wound #1 Right Toe Great o Topical Lidocaine 4% cream applied to wound bed prior to debridement - in clinic Primary Wound Dressing Wound #1 Right Toe Great o Other: - endoform Secondary Dressing Wound #1 Right Toe Great o Foam Dressing Change Frequency Wound #1 Right Toe Great o Change dressing every week Follow-up Appointments Wound #1 Right Toe Great o Return Appointment in 1 week. Off-Loading Theresa Jensen, Theresa Jensen ([id number]) Wound #1 Right Toe Great o Total Contact Cast to Right Lower Extremity Electronic Signature(s) Signed: 02/07/2017 5:10:12 PM By: Elliot Gurney, BSN, RN, CWS, Kim RN, BSN Signed: 02/09/2017 4:12:03 PM By: Baltazar Najjar MD Entered By: Elliot Gurney, BSN, RN, CWS, Kim on 02/07/2017 17:10:12 Theresa Jensen ([id number]) -------------------------------------------------------------------------------- Problem List Details Patient Name: Theresa Jensen Date of Service: 02/01/2017 2:45 PM Medical Record Patient Account  Number: [id number]  [id number] Number: Treating RN: Huel Coventry July 07, 1945 (70 y.o. Other Clinician: Date of Birth/Sex: Female) Treating ROBSON, MICHAEL Primary Care Provider: Ruffin Pyo Provider/Extender: G Referring Provider: Ruffin Pyo Weeks in Treatment: 11 Active Problems ICD-10 Encounter Code Description Active Date Diagnosis E11.621 Type 2 diabetes mellitus with foot ulcer 11/11/2016 Yes L97.512 Non-pressure chronic ulcer of other part of right foot with 11/11/2016 Yes fat layer exposed E11.40 Type 2 diabetes mellitus with diabetic neuropathy, 11/11/2016 Yes unspecified Inactive Problems Resolved Problems ICD-10 Code Description Active Date Resolved Date L03.031 Cellulitis of right toe 11/11/2016 11/11/2016 Electronic Signature(s) Signed: 02/02/2017 8:40:51 AM By: Baltazar Najjar MD Entered By: Baltazar Najjar on 02/01/2017 16:24:31 Theresa Jensen ([id number]) -------------------------------------------------------------------------------- Progress Note Details Patient Name: Theresa Jensen Date of Service: 02/01/2017 2:45 PM Medical Record Patient Account Number: [id number] Number: Treating RN: Huel Coventry Feb 22, 1946 (70 y.o. Other Clinician: Date of Birth/Sex: Female) Treating ROBSON, MICHAEL Primary Care Provider: Ruffin Pyo Provider/Extender: G Referring Provider: Ruffin Pyo Weeks in Treatment: 11 Subjective History of Present Illness (HPI) 11/10/16 patient presents for evaluation as referral from her primary care provider, Ruffin Pyo, MD who has been treating her right great toe wound. Unfortunately she has had issues with diabetic control and they are working on that. Nonetheless her hemoglobin A1c on last check which was April 2018 with 8.2. She did have an x-ray which was performed and evaluated by her primary on 09/07/16 which is commented in the note to show no bone involvement. I'll see this is good news. Mainly she has been performing  wet to dry packing of the wound and on the Sep 07, 2016 encounter it was noted that her primary did pair down some of the callus surrounding the wound. However no excisional debridement of the wound margins especially where it is immediately undermining and rolled is noted. She has been on two rounds of antibiotics clindamycin and Cipro fortunately there does not appear to be any sign of infection at this point. 11/17/16; patient admitted to the clinic last week with a diabetic foot ulcer on the plantar right great toe 11/24/16; plantar aspect of her right great toe dfu Wagner grade 2. Using Prisma 12/01/16; plantar aspect of her right great toe DF U Wagner grade 2 using Prisma without real change. ABI is 1. Previous x-ray showed no bone involvement 12/08/16 on evaluation today patient's ulcer appears to be doing fairly well. She continues to have some callous buildup surrounding the ulcer region and she has some Slough noted on the surface of the wound but overall this is doing fairly well. No fevers, chills, nausea, or vomiting noted at this time. 12/16/16 on evaluation today patient appears to be doing fairly well in regard to her right great toe wound. She has been tolerating dressing changes without complication and continues to wear her offloading shoe. Fortunately she has no overall worsening symptoms deftly no evidence of infection. She experiences no pain. No fevers, chills, nausea, or vomiting noted at this time. 12/29/16; patient with a slitlike wound on the plantar aspect of her right great toe. She has been packing silver collagen into this wound however the area is 0.8 cm of depth medially. She is wearing a [employer] forefoot offloading boot however it sounds as though she is very active. 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 She has been using  silver  collagen. [place] [employer] forefoot off loader in spite of this her wound really hasn't looked too bad. She'll be here next week and then absent the next week for her equestrian event. 01/11/17; small slitlike wound on the plantar aspect of her right great toe however with considerable relative depth. I changed her to Endoform last week although her intake nurse was not sure that's what she was using. She is using a [employer] forefoot off loader however she is a very active woman and has difficulty with the idea of a total contact cast especially as it applies to driving. She does not have obvious arterial issues. Consideration will need to be given of imaging studies repeated. 01/19/17; patient has been active on her property as a result of the inclement weather. Wound actually looks some better no longer quite as slitlike periods one edge of the wound actually looked quite good well the other required debridement we have been using Endoform 01/26/17; wound is per usual. She arrives with callus, skin and subcutaneous tissue around this linear wound Theresa Jensen, Theresa Jensen ([id number]) bed. Reasonably easy to debride and get to a healthy surface although we are not filling this and even with use of Endoform. I think this is a result of excessive pressure even in the [employer] offloading 02/01/17; wound looks much as usual. I elected not to debride this today we put her in a total contact cast with Endoform. She tells me I have 2 weeks to get this to close Objective Constitutional Supine Blood Pressure is within target range for patient.. Pulse regular and within target range for patient.Marland Kitchen Respirations regular, non-labored and within target range.. Temperature is normal and within the target range for the patient.Marland Kitchen appears in no distress. Vitals Time Taken: 2:42 PM, Height: 65 in, Weight: 187 lbs, BMI: 31.1, Temperature: 98.2 F, Pulse: 97 bpm, Respiratory Rate: 16 breaths/min,  Blood Pressure: 112/52 mmHg. General Notes: Wound exam; same unusual pattern to this linear wound on the plantar toe. I did not debridement this today preferring to offload this for a week before I have another look at this. Continue Endoform to the linear slitlike wound opening Integumentary (Hair, Skin) Wound #1 status is Open. Original cause of wound was Gradually Appeared. The wound is located on the Right Toe Great. The wound measures 0.9cm length x 0.3cm width x 0.5cm depth; 0.212cm^2 area and 0.106cm^3 volume. There is Fat Layer (Subcutaneous Tissue) Exposed exposed. There is no tunneling or undermining noted. There is a medium amount of serosanguineous drainage noted. The wound margin is distinct with the outline attached to the wound base. There is medium (34-66%) red granulation within the wound bed. There is a medium (34-66%) amount of necrotic tissue within the wound bed including Adherent Slough. The periwound skin appearance exhibited: Callus. The periwound skin appearance did not exhibit: Crepitus, Excoriation, Induration, Rash, Scarring, Dry/Scaly, Maceration, Atrophie Blanche, Cyanosis, Ecchymosis, Hemosiderin Staining, Mottled, Pallor, Rubor, Erythema. Periwound temperature was noted as No Abnormality. Assessment Active Problems ICD-10 E11.621 - Type 2 diabetes mellitus with foot ulcer L97.512 - Non-pressure chronic ulcer of other part of right foot with fat layer exposed Theresa Jensen, Theresa Jensen ([id number]) E11.40 - Type 2 diabetes mellitus with diabetic neuropathy, unspecified Procedures Wound #1 Pre-procedure diagnosis of Wound #1 is a Diabetic Wound/Ulcer of the Lower Extremity located on the Right Toe Great . There was a Total Contact Cast Procedure by Maxwell Caul, MD. Post procedure Diagnosis Wound #1: Same as  Pre-Procedure Plan Wound Cleansing: Wound #1 Right Toe Great: Cleanse wound with mild soap and water May Shower, gently pat wound dry prior to applying new  dressing. May shower with protection. Anesthetic: Wound #1 Right Toe Great: Topical Lidocaine 4% cream applied to wound bed prior to debridement - in clinic Primary Wound Dressing: Wound #1 Right Toe Great: Other: - endoform Secondary Dressing: Wound #1 Right Toe Great: Foam Dressing Change Frequency: Wound #1 Right Toe Great: Change dressing every week Follow-up Appointments: Wound #1 Right Toe Great: Return Appointment in 1 week. Off-Loading: Wound #1 Right Toe Great: Total Contact Cast to Right Lower Extremity [place], Theresa Jensen ([id number]) TCC #1 continue with endoform we will not be able to the obligatory first cast change. I've asked her to call if there are problems Electronic Signature(s) Signed: 02/14/2017 1:50:36 PM By: Elliot Gurney, BSN, RN, CWS, Kim RN, BSN Signed: 02/16/2017 5:51:56 PM By: Baltazar Najjar MD Previous Signature: 02/02/2017 8:40:51 AM Version By: Baltazar Najjar MD Entered By: Elliot Gurney, BSN, RN, CWS, Kim on 02/14/2017 13:50:35 Theresa Jensen ([id number]) -------------------------------------------------------------------------------- Total Contact Cast Details Patient Name: Theresa Jensen Date of Service: 02/01/2017 2:45 PM Medical Record Patient Account Number: [id number] Number: Treating RN: Huel Coventry 23-May-1945 (70 y.o. Other Clinician: Date of Birth/Sex: Female) Treating ROBSON, MICHAEL Primary Care Provider: Ruffin Pyo Provider/Extender: G Referring Provider: Ruffin Pyo Weeks in Treatment: 11 Total Contact Cast Applied for Wound Wound #1 Right Toe Great Assessment: Performed By: Physician Maxwell Caul, MD Post Procedure Diagnosis Same as Pre-procedure Electronic Signature(s) Signed: 02/02/2017 8:40:51 AM By: Baltazar Najjar MD Entered By: Baltazar Najjar on 02/01/2017 16:31:11 Theresa Jensen ([id number]) -------------------------------------------------------------------------------- SuperBill Details Patient Name:  Theresa Jensen Date of Service: 02/01/2017 Medical Record Patient Account Number: [id number] Number: Treating RN: Huel Coventry 01-26-1946 (70 y.o. Other Clinician: Date of Birth/Sex: Female) Treating ROBSON, MICHAEL Primary Care Provider: Ruffin Pyo Provider/Extender: G Referring Provider: Ruffin Pyo Weeks in Treatment: 11 Diagnosis Coding ICD-10 Codes Code Description E11.621 Type 2 diabetes mellitus with foot ulcer L97.512 Non-pressure chronic ulcer of other part of right foot with fat layer exposed E11.40 Type 2 diabetes mellitus with diabetic neuropathy, unspecified Facility Procedures CPT4 Code Description: [id number] - APPLY TOTAL CONTACT LEG CAST ICD-10 Description Diagnosis E11.621 Type 2 diabetes mellitus with foot ulcer L97.512 Non-pressure chronic ulcer of other part of right foo Modifier: t with fat la Quantity: 1 yer exposed Physician Procedures CPT4 Code Description: [id number] - WC PHYS APPLY TOTAL CONTACT CAST ICD-10 Description Diagnosis E11.621 Type 2 diabetes mellitus with foot ulcer L97.512 Non-pressure chronic ulcer of other part of right foo Modifier: t with fat lay Quantity: 1 er exposed Electronic Signature(s) Signed: 02/02/2017 8:40:51 AM By: Baltazar Najjar MD Entered By: Baltazar Najjar on 02/01/2017 16:31:43

## 2017-02-08 ENCOUNTER — Encounter: Payer: Medicare Other | Admitting: Internal Medicine

## 2017-02-08 DIAGNOSIS — E11621 Type 2 diabetes mellitus with foot ulcer: Secondary | ICD-10-CM | POA: Diagnosis not present

## 2017-02-09 NOTE — Progress Notes (Signed)
SHAREEKA, YIM ([id number]) Visit Report for 02/08/2017 HPI Details Patient Name: Theresa Jensen, Theresa Jensen Date of Service: 02/08/2017 1:45 PM Medical Record Patient Account Number: [id number] Number: Treating RN: Huel Coventry 05-Mar-1946 (71 y.o. Other Clinician: Date of Birth/Sex: Female) Treating ROBSON, MICHAEL Primary Care Provider: Ruffin Pyo Provider/Extender: G Referring Provider: Ruffin Pyo Weeks in Treatment: 12 History of Present Illness HPI Description: 11/10/16 patient presents for evaluation as referral from her primary care provider, Ruffin Pyo, MD who has been treating her right great toe wound. Unfortunately she has had issues with diabetic control and they are working on that. Nonetheless her hemoglobin A1c on last check which was April 2018 with 8.2. She did have an x-ray which was performed and evaluated by her primary on 09/07/16 which is commented in the note to show no bone involvement. I'll see this is good news. Mainly she has been performing wet to dry packing of the wound and on the Sep 07, 2016 encounter it was noted that her primary did pair down some of the callus surrounding the wound. However no excisional debridement of the wound margins especially where it is immediately undermining and rolled is noted. She has been on two rounds of antibiotics clindamycin and Cipro fortunately there does not appear to be any sign of infection at this point. 11/17/16; patient admitted to the clinic last week with a diabetic foot ulcer on the plantar right great toe 11/24/16; plantar aspect of her right great toe dfu Wagner grade 2. Using Prisma 12/01/16; plantar aspect of her right great toe DF U Wagner grade 2 using Prisma without real change. ABI is 1. Previous x-ray showed no bone involvement 12/08/16 on evaluation today patient's ulcer appears to be doing fairly well. She continues to have some callous buildup surrounding the ulcer region and she has some Slough noted  on the surface of the wound but overall this is doing fairly well. No fevers, chills, nausea, or vomiting noted at this time. 12/16/16 on evaluation today patient appears to be doing fairly well in regard to her right great toe wound. She has been tolerating dressing changes without complication and continues to wear her offloading shoe. Fortunately she has no overall worsening symptoms deftly no evidence of infection. She experiences no pain. No fevers, chills, nausea, or vomiting noted at this time. 12/29/16; patient with a slitlike wound on the plantar aspect of her right great toe. She has been packing silver collagen into this wound however the area is 0.8 cm of depth medially. She is wearing a [employer] forefoot offloading boot however it sounds as though she is very active. 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 She has been using silver collagen. [place] her [employer] forefoot off loader in spite of this her wound really hasn't looked too bad. She'll be here next week and then absent the next week for her equestrian event. 01/11/17; small slitlike wound on the plantar aspect of her right great toe however with considerable relative depth. I changed her to Endoform last week although her intake nurse was not sure that's what she was using. She is using a Dar[employer] forefoot off loader however she is a very active woman and has difficulty with the idea of a total contact cast especially as it applies to driving. She does  not have obvious arterial issues. Consideration will need to be given of imaging studies repeated. 01/19/17; patient has been active on her property as a result of the inclement weather. Wound actually looks [place], Theresa Jensen ([id number]) some better no longer quite as slitlike periods one edge of the wound actually  looked quite good well the other required debridement we have been using Endoform 01/26/17; wound is per usual. She arrives with callus, skin and subcutaneous tissue around this linear wound bed. Reasonably easy to debride and get to a healthy surface although we are not filling this and even with use of Endoform. I think this is a result of excessive pressure even in the Darco offloading 02/01/17; wound looks much as usual. I elected not to debride this today we put her in a total contact cast with Endoform. She tells me I have 2 weeks to get this to close 02/08/17; we put this patient in a total contact cast last week and she has a completely viable surface over the wound after I appeared back some of the surrounding callus. Electronic Signature(s) Signed: 02/08/2017 4:33:30 PM By: Baltazar Najjar MD Entered By: Baltazar Najjar on 02/08/2017 15:35:21 Theresa Jensen ([id number]) -------------------------------------------------------------------------------- Callus Pairing Details Patient Name: Theresa Jensen Date of Service: 02/08/2017 1:45 PM Medical Record Patient Account Number: [id number] Number: Treating RN: Huel Coventry 1945/11/17 (71 y.o. Other Clinician: Date of Birth/Sex: Female) Treating ROBSON, MICHAEL Primary Care Provider: Ruffin Pyo Provider/Extender: G Referring Provider: Ruffin Pyo Weeks in Treatment: 12 Procedure Performed for: Wound #1 Right Toe Great Performed By: Physician Maxwell Caul, MD Post Procedure Diagnosis Same as Pre-procedure Electronic Signature(s) Signed: 02/08/2017 4:33:30 PM By: Baltazar Najjar MD Entered By: Baltazar Najjar on 02/08/2017 15:33:40 Theresa Jensen ([id number]) -------------------------------------------------------------------------------- Physical Exam Details Patient Name: Theresa Jensen Date of Service: 02/08/2017 1:45 PM Medical Record Patient Account Number: [id number] Number: Treating RN:  Huel Coventry [birth year] (71 y.o. Other Clinician: Date of Birth/Sex: Female) Treating ROBSON, MICHAEL Primary Care Provider: Ruffin Pyo Provider/Extender: G Referring Provider: Ruffin Pyo Weeks in Treatment: 12 Constitutional Sitting or standing Blood Pressure is within target range for patient.. Pulse regular and within target range for patient.Marland Kitchen Respirations regular, non-labored and within target range.. Temperature is normal and within the target range for the patient.Marland Kitchen appears in no distress. Notes Wound exam; the areas on the plantar aspect of the right great toe. This had a thick surface over the wound today. Using a #3 curet I pared back callus and nonviable skin but there is no open area here. No evidence of surrounding infection Electronic Signature(s) Signed: 02/08/2017 4:33:30 PM By: Baltazar Najjar MD Entered By: Baltazar Najjar on 02/08/2017 15:36:25 Theresa Jensen ([id number]) -------------------------------------------------------------------------------- Physician Orders Details Patient Name: Theresa Jensen Date of Service: 02/08/2017 1:45 PM Medical Record Patient Account Number: [id number] Number: Treating RN: Huel Coventry 03-Jul-1945 (70 y.o. Other Clinician: Date of Birth/Sex: Female) Treating ROBSON, MICHAEL Primary Care Provider: Ruffin Pyo Provider/Extender: G Referring Provider: Ruffin Pyo Weeks in Treatment: 12 Verbal / Phone Orders: No Diagnosis Coding Discharge From [facility] Services Wound #1 Right Toe Great o Discharge from Wound Care Center - treatment complete Electronic Signature(s) Signed: 02/08/2017 4:33:30 PM By: Baltazar Najjar MD Signed: 02/08/2017 5:44:12 PM By: Elliot Gurney, BSN, RN, CWS, Kim RN, BSN Entered By: Elliot Gurney, BSN, RN, CWS, Kim on 02/08/2017 14:23:51 Theresa Jensen ([id number]) -------------------------------------------------------------------------------- Problem List Details Patient Name: Theresa Jensen Date of  Service: 02/08/2017 1:45 PM Medical Record Patient Account Number: [id number] Number: Treating RN: Huel Coventry  [birth year] (71 y.o. 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 E11.40 Type 2 diabetes mellitus with diabetic neuropathy, 11/11/2016 Yes unspecified Inactive Problems Resolved Problems ICD-10 Code Description Active Date Resolved Date L03.031 Cellulitis of right toe 11/11/2016 11/11/2016 Electronic Signature(s) Signed: 02/08/2017 4:33:30 PM By: Baltazar Najjar MD Entered By: Baltazar Najjar on 02/08/2017 15:33:07 Theresa Jensen ([id number]) -------------------------------------------------------------------------------- Progress Note Details Patient Name: Theresa Jensen Date of Service: 02/08/2017 1:45 PM Medical Record Patient Account Number: [id number] Number: Treating RN: Huel Coventry 1945/08/01 (70 y.o. Other Clinician: Date of Birth/Sex: Female) Treating ROBSON, MICHAEL Primary Care Provider: Ruffin Pyo Provider/Extender: G Referring Provider: Ruffin Pyo Weeks in Treatment: 12 Subjective History of Present Illness (HPI) 11/10/16 patient presents for evaluation as referral from her primary care provider, Ruffin Pyo, MD who has been treating her right great toe wound. Unfortunately she has had issues with diabetic control and they are working on that. Nonetheless her hemoglobin A1c on last check which was April 2018 with 8.2. She did have an x-ray which was performed and evaluated by her primary on 09/07/16 which is commented in the note to show no bone involvement.  I'll see this is good news. Mainly she has been performing wet to dry packing of the wound and on the Sep 07, 2016 encounter it was noted that her primary did pair down some of the callus surrounding the wound. However no excisional debridement of the wound margins especially where it is immediately undermining and rolled is noted. She has been on two rounds of antibiotics clindamycin and Cipro fortunately there does not appear to be any sign of infection at this point. 11/17/16; patient admitted to the clinic last week with a diabetic foot ulcer on the plantar right great toe 11/24/16; plantar aspect of her right great toe dfu Wagner grade 2. Using Prisma 12/01/16; plantar aspect of her right great toe DF U Wagner grade 2 using Prisma without real change. ABI is 1. Previous x-ray showed no bone involvement 12/08/16 on evaluation today patient's ulcer appears to be doing fairly well. She continues to have some callous buildup surrounding the ulcer region and she has some Slough noted on the surface of the wound but overall this is doing fairly well. No fevers, chills, nausea, or vomiting noted at this time. 12/16/16 on evaluation today patient appears to be doing fairly well in regard to her right great toe wound. She has been tolerating dressing changes without complication and continues to wear her offloading shoe. Fortunately she has no overall worsening symptoms deftly no evidence of infection. She experiences no pain. No fevers, chills, nausea, or vomiting noted at this time. 12/29/16; patient with a slitlike wound on the plantar aspect of her right great toe. She has been packing silver collagen into this wound however the area is 0.8 cm of depth medially. She is wearing a [employer] forefoot offloading boot however it sounds as though she is very active. 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 She has been using silver collagen. [place] in her [employer] forefoot off loader in spite of this her wound really hasn't looked too bad. She'll be here next week and then absent the next week for her equestrian event. 01/11/17; small slitlike wound on the plantar aspect of her right great toe however with considerable relative depth. I changed her to Endoform last week although her intake nurse was not sure that's what she was using. She is using a [employer] forefoot off loader however she is a very active woman and has difficulty with the idea of a total contact cast especially as it applies to driving. She does not have obvious arterial issues. Consideration will need to be given of imaging studies repeated. 01/19/17; patient has been active on her property as a result of the inclement weather. Wound actually looks some better no longer quite as slitlike periods one edge of the wound actually looked quite good well the other required debridement we have been using Endoform 01/26/17; wound is per usual. She arrives with callus, skin and subcutaneous tissue around this linear wound [place], Alantis ([id number]) bed. Reasonably easy to debride and get to a healthy surface although we are not filling this and even with use of Endoform. I think this is a result of excessive pressure even in the Darco offloading 02/01/17; wound looks much as usual. I elected not to debride this today we put her in a total contact cast with Endoform. She tells me I have 2 weeks to get this to close 02/08/17; we put this patient in a total contact cast last week and she has a completely viable surface over the wound after I appeared back some of the surrounding callus. Objective Constitutional Sitting or standing Blood Pressure is within target range for patient.. Pulse regular and within target range for patient.Marland Kitchen Respirations regular, non-labored and within  target range.. Temperature is normal and within the target range for the patient.Marland Kitchen appears in no distress. Vitals Time Taken: 2:01 PM, Height: 65 in, Weight: 187 lbs, BMI: 31.1, Temperature: 97.8 F, Pulse: 86 bpm, Respiratory Rate: 16 breaths/min, Blood Pressure: 113/53 mmHg. General Notes: Wound exam; the areas on the plantar aspect of the right great toe. This had a thick surface over the wound today. Using a #3 curet I pared back callus and nonviable skin but there is no open area here. No evidence of surrounding infection Integumentary (Hair, Skin) Wound #1 status is Healed - Epithelialized. Original cause of wound was Gradually Appeared. The wound is located on the Right Toe Great. The wound measures 0cm length x 0cm width x 0cm depth; 0cm^2 area and 0cm^3 volume. There is Fat Layer (Subcutaneous Tissue) Exposed exposed. There is no tunneling or undermining noted. There is a medium amount of serosanguineous drainage noted. The wound margin is distinct with the outline attached to the wound base. There is no granulation within the wound bed. There is a large (67-100%) amount of necrotic tissue within the wound bed including Eschar and Adherent Slough. The periwound skin appearance exhibited: Callus. The periwound skin appearance did not exhibit: Crepitus, Excoriation, Induration, Rash, Scarring, Dry/Scaly, Maceration, Atrophie Blanche, Cyanosis, Ecchymosis, Hemosiderin Staining, Mottled, Pallor, Rubor, Erythema. Periwound temperature was noted as No Abnormality. Assessment Active Problems JERALDINE, PRIMEAU ([id number]) ICD-10 E11.621 - Type 2 diabetes mellitus with foot ulcer L97.512 - Non-pressure chronic ulcer of other part of right foot with fat layer exposed E11.40 - Type 2 diabetes mellitus with diabetic neuropathy, unspecified Procedures Wound #1 Pre-procedure diagnosis of Wound #1 is  a Diabetic Wound/Ulcer of the Lower Extremity located on the Right Toe Great . An Callus Pairing  procedure was performed by Maxwell Caul, MD. Post procedure Diagnosis Wound #1: Same as Pre-Procedure Plan [facility]: Wound #1 Right Toe Great: Discharge from Wound Care Center - treatment complete #1 the patient to be discharged from the wound care center #2 we fashioned some foam padding around the toe and I impressed upon her that she is going to need inserts in her shoes #3 she tells me she has diabetic shoes but I'm not certain how much she uses them. She says there are 71 years old, since she spends a lot of time working on her property perhaps diabetic work boots with Scientist, research (physical sciences)) Signed: 02/08/2017 4:33:30 PM By: Baltazar Najjar MD Entered By: Baltazar Najjar on 02/08/2017 15:39:23 Theresa Jensen ([id number]) -------------------------------------------------------------------------------- SuperBill Details Patient Name: Theresa Jensen Date of Service: 02/08/2017 Medical Record Patient Account Number: [id number] Number: Treating RN: Huel Coventry 07/28/1945 (70 y.o. Other Clinician: Date of Birth/Sex: Female) Treating ROBSON, MICHAEL Primary Care Provider: Ruffin Pyo Provider/Extender: G Referring Provider: Ruffin Pyo Weeks in Treatment: 12 Diagnosis Coding ICD-10 Codes Code Description E11.621 Type 2 diabetes mellitus with foot ulcer L97.512 Non-pressure chronic ulcer of other part of right foot with fat layer exposed E11.40 Type 2 diabetes mellitus with diabetic neuropathy, unspecified Facility Procedures CPT4 Code Description: [id number] 11055 - PARE BENIGN LES; SGL ICD-10 Description Diagnosis E11.621 Type 2 diabetes mellitus with foot ulcer L97.512 Non-pressure chronic ulcer of other part of right f Modifier: oot with fat la Quantity: 1 yer exposed Physician Procedures CPT4 Code Description: [id number] 11055 - WC PHYS PARE BENIGN LES; SGL ICD-10 Description Diagnosis E11.621 Type 2 diabetes mellitus with  foot ulcer L97.512 Non-pressure chronic ulcer of other part of right foo Modifier: t with fat lay Quantity: 1 er exposed Electronic Signature(s) Signed: 02/08/2017 4:33:30 PM By: Baltazar Najjar MD Entered By: Baltazar Najjar on 02/08/2017 15:39:46

## 2017-02-10 NOTE — Progress Notes (Addendum)
HULA, TASSO ([id number]) Visit Report for 02/08/2017 Arrival Information Details Patient Name: Theresa Jensen Date of Service: 02/08/2017 1:45 PM Medical Record Patient Account Number: [id number] Number: Treating RN: Huel Coventry 1945/10/07 (70 y.o. Other Clinician: Date of Birth/Sex: Female) Treating ROBSON, MICHAEL Primary Care Demetruis Depaul: Ruffin Pyo Sabrinia Prien/Extender: G Referring Jacori Mulrooney: Ruffin Pyo Weeks in Treatment: 12 Visit Information History Since Last Visit Added or deleted any medications: No Patient Arrived: Ambulatory Any new allergies or adverse reactions: No Arrival Time: 14:00 Had a fall or experienced change in No Accompanied By: self activities of daily living that may affect Transfer Assistance: None risk of falls: Patient Identification Verified: Yes Signs or symptoms of abuse/neglect since No Secondary Verification Process Yes last visito Completed: Hospitalized since last visit: No Patient Requires Transmission- No Has Dressing in Place as Prescribed: Yes Based Precautions: Has Footwear/Offloading in Place as Yes Patient Has Alerts: Yes Prescribed: Patient Alerts: Patient on Blood Right: Total Contact Thinner Cast Hgb A1C 8.2 Pain Present Now: Yes (may) Electronic Signature(s) Signed: 02/08/2017 5:44:12 PM By: Elliot Gurney, BSN, RN, CWS, Kim RN, BSN Entered By: Elliot Gurney, BSN, RN, CWS, Kim on 02/08/2017 14:00:31 Theresa Jensen ([id number]) -------------------------------------------------------------------------------- Encounter Discharge Information Details Patient Name: Theresa Jensen Date of Service: 02/08/2017 1:45 PM Medical Record Patient Account Number: [id number] Number: Treating RN: Huel Coventry [birth year] (70 y.o. Other Clinician: Date of Birth/Sex: Female) Treating ROBSON, MICHAEL Primary Care Cammy Sanjurjo: Ruffin Pyo Koray Soter/Extender: G Referring Art Levan: Ruffin Pyo Weeks in Treatment: 12 Encounter  Discharge Information Items Discharge Pain Level: 0 Discharge Condition: Stable Ambulatory Status: Ambulatory Discharge Destination: Home Transportation: Private Auto Accompanied By: self Schedule Follow-up Appointment: Yes Medication Reconciliation completed and provided to Patient/Care Yes Hari Casaus: Provided on Clinical Summary of Care: 02/08/2017 Form Type Recipient Paper Patient DS Electronic Signature(s) Signed: 02/09/2017 8:44:55 AM By: Gwenlyn Perking Entered By: Gwenlyn Perking on 02/08/2017 14:34:27 Theresa Jensen ([id number]) -------------------------------------------------------------------------------- Lower Extremity Assessment Details Patient Name: Theresa Jensen Date of Service: 02/08/2017 1:45 PM Medical Record Patient Account Number: [id number] Number: Treating RN: Huel Coventry 06-Oct-1945 (70 y.o. Other Clinician: Date of Birth/Sex: Female) Treating ROBSON, MICHAEL Primary Care Sajid Ruppert: Ruffin Pyo Tylie Golonka/Extender: G Referring Nashton Belson: Ruffin Pyo Weeks in Treatment: 12 Vascular Assessment Pulses: Dorsalis Pedis Palpable: [Right:Yes] Posterior Tibial Extremity colors, hair growth, and conditions: Extremity Color: [Right:Normal] Temperature of Extremity: [Right:Warm] Capillary Refill: [Right:< 3 seconds] Toe Nail Assessment Left: Right: Thick: Yes Discolored: Yes Deformed: Yes Improper Length and Hygiene: Yes Electronic Signature(s) Signed: 02/08/2017 5:44:12 PM By: Elliot Gurney, BSN, RN, CWS, Kim RN, BSN Entered By: Elliot Gurney, BSN, RN, CWS, Kim on 02/08/2017 14:12:00 Theresa Jensen ([id number]) -------------------------------------------------------------------------------- Multi Wound Chart Details Patient Name: Theresa Jensen Date of Service: 02/08/2017 1:45 PM Medical Record Patient Account Number: [id number] Number: Treating RN: Huel Coventry 24-Oct-1945 (70 y.o. Other Clinician: Date of Birth/Sex: Female) Treating ROBSON,  MICHAEL Primary Care Dhruvan Gullion: Ruffin Pyo Sherlin Sonier/Extender: G Referring Lucius Wise: Ruffin Pyo Weeks in Treatment: 12 Vital Signs Height(in): 65 Pulse(bpm): 86 Weight(lbs): 187 Blood Pressure 113/53 (mmHg): Body Mass Index(BMI): 31 Temperature(F): 97.8 Respiratory Rate 16 (breaths/min): Photos: [N/A:N/A] Wound Location: Right Toe Great N/A N/A Wounding Event: Gradually Appeared N/A N/A Primary Etiology: Diabetic Wound/Ulcer of N/A N/A the Lower Extremity Comorbid History: Anemia, Hypertension, N/A N/A Type II Diabetes, Osteoarthritis, Neuropathy Date Acquired: 07/06/2016 N/A N/A Weeks of Treatment: 12 N/A N/A Wound Status: Healed - Epithelialized N/A N/A Measurements L x W x D 0x0x0 N/A N/A (cm) Area (cm) : 0 N/A N/A Volume (cm) : 0 N/A N/A %  Reduction in Area: 100.00% N/A N/A % Reduction in Volume: 100.00% N/A N/A Classification: Grade 1 N/A N/A Exudate Amount: Medium N/A N/A Exudate Type: Serosanguineous N/A N/A Exudate Color: red, brown N/A N/A Wound Margin: Distinct, outline attached N/A N/A Granulation Amount: None Present (0%) N/A N/A Theresa Jensen ([id number]) Necrotic Amount: Large (67-100%) N/A N/A Theresa Jensen: Eschar, Adherent Slough N/A N/A Exposed Structures: Fat Layer (Subcutaneous N/A N/A Tissue) Exposed: Yes Fascia: No Tendon: No Muscle: No Joint: No Bone: No Epithelialization: None N/A N/A Periwound Skin Texture: Callus: Yes N/A N/A Excoriation: No Induration: No Crepitus: No Rash: No Scarring: No Periwound Skin Maceration: No N/A N/A Moisture: Dry/Scaly: No Periwound Skin Color: Atrophie Blanche: No N/A N/A Cyanosis: No Ecchymosis: No Erythema: No Hemosiderin Staining: No Mottled: No Pallor: No Rubor: No Temperature: No Abnormality N/A N/A Tenderness on No N/A N/A Palpation: Wound Preparation: Ulcer Cleansing: N/A N/A Rinsed/Irrigated with Saline Topical Anesthetic Applied: Other: lidocaine 4% Procedures  Performed: Callus Pairing N/A N/A Treatment Notes Electronic Signature(s) Signed: 02/08/2017 4:33:30 PM By: Baltazar Najjar MD Entered By: Baltazar Najjar on 02/08/2017 15:33:27 Theresa Jensen ([id number]) -------------------------------------------------------------------------------- Multi-Disciplinary Care Plan Details Patient Name: Theresa Jensen Date of Service: 02/08/2017 1:45 PM Medical Record Patient Account Number: [id number] Number: Treating RN: Huel Coventry 10-Jun-1945 (70 y.o. Other Clinician: Date of Birth/Sex: Female) Treating ROBSON, MICHAEL Primary Care Hades Mathew: Ruffin Pyo Dehlia Kilner/Extender: G Referring Cristal Howatt: Ruffin Pyo Weeks in Treatment: 12 Active Inactive Electronic Signature(s) Signed: 02/14/2017 9:11:48 AM By: Elliot Gurney, BSN, RN, CWS, Kim RN, BSN Previous Signature: 02/14/2017 9:11:08 AM Version By: Elliot Gurney, BSN, RN, CWS, Kim RN, BSN Previous Signature: 02/08/2017 5:44:12 PM Version By: Elliot Gurney, BSN, RN, CWS, Kim RN, BSN Entered By: Elliot Gurney, BSN, RN, CWS, Kim on 02/14/2017 09:11:47 Theresa Jensen ([id number]) -------------------------------------------------------------------------------- Pain Assessment Details Patient Name: Theresa Jensen Date of Service: 02/08/2017 1:45 PM Medical Record Patient Account Number: [id number] Number: Treating RN: Huel Coventry 1945-11-30 (70 y.o. Other Clinician: Date of Birth/Sex: Female) Treating ROBSON, MICHAEL Primary Care Mardel Grudzien: Ruffin Pyo Johnell Bas/Extender: G Referring Melora Menon: Ruffin Pyo Weeks in Treatment: 12 Active Problems Location of Pain Severity and Description of Pain Patient Has Paino No Site Locations With Dressing Change: No Pain Management and Medication Current Pain Management: Electronic Signature(s) Signed: 02/08/2017 5:44:12 PM By: Elliot Gurney, BSN, RN, CWS, Kim RN, BSN Entered By: Elliot Gurney, BSN, RN, CWS, Kim on 02/08/2017 14:01:01 Theresa Jensen  ([id number]) -------------------------------------------------------------------------------- Patient/Caregiver Education Details Patient Name: Theresa Jensen Date of Service: 02/08/2017 1:45 PM Medical Record Patient Account Number: [id number] Number: Treating RN: Huel Coventry [birth year] (70 y.o. Other Clinician: Date of Birth/Gender: Female) Treating ROBSON, MICHAEL Primary Care Physician/Extender: Milana Kidney, REBECCA Physician: Tania Ade in Treatment: 12 Referring Physician: Ruffin Pyo Education Assessment Education Provided To: Patient Education Topics Provided Pressure: Handouts: Preventing Pressure Ulcers, Other: keep pressure off great toe Methods: Demonstration Responses: State content correctly Wound/Skin Impairment: Electronic Signature(s) Signed: 02/08/2017 5:44:12 PM By: Elliot Gurney, BSN, RN, CWS, Kim RN, BSN Entered By: Elliot Gurney, BSN, RN, CWS, Kim on 02/08/2017 14:28:36 Theresa Jensen ([id number]) -------------------------------------------------------------------------------- Wound Assessment Details Patient Name: Theresa Jensen Date of Service: 02/08/2017 1:45 PM Medical Record Patient Account Number: [id number] Number: Treating RN: Huel Coventry 1945-08-17 (70 y.o. Other Clinician: Date of Birth/Sex: Female) Treating ROBSON, MICHAEL Primary Care Lilyahna Sirmon: Ruffin Pyo Kataleia Quaranta/Extender: G Referring Akiva Brassfield: Ruffin Pyo Weeks in Treatment: 12 Wound Status Wound Number: 1 Primary Diabetic Wound/Ulcer of the Lower Etiology: Extremity Wound Location: Right Toe Great Wound Healed - Epithelialized Wounding Event: Gradually Appeared Status: Date  Acquired: 07/06/2016 Comorbid Anemia, Hypertension, Type II Weeks Of Treatment: 12 History: Diabetes, Osteoarthritis, Neuropathy Clustered Wound: No Photos Wound Measurements Length: (cm) 0 % Reductio Width: (cm) 0 % Reductio Depth: (cm) 0 Epithelial Area: (cm) 0 Tunneling Volume: (cm) 0  Undermini n in Area: 100% n in Volume: 100% ization: None : No ng: No Wound Description Classification: Grade 1 Wound Margin: Distinct, outline attached Exudate Amount: Medium Exudate Type: Serosanguineous Exudate Color: red, brown Foul Odor After Cleansing: No Slough/Fibrino Yes Wound Bed Granulation Amount: None Present (0%) Exposed Structure Necrotic Amount: Large (67-100%) Fascia Exposed: No Necrotic Quality: Eschar, Adherent Slough Fat Layer (Subcutaneous Tissue) Exposed: Yes Tendon Exposed: No Muscle Exposed: No Joint Exposed: No Higham, Terril ([id number]) Bone Exposed: No Periwound Skin Texture Texture Color No Abnormalities Noted: No No Abnormalities Noted: No Callus: Yes Atrophie Blanche: No Crepitus: No Cyanosis: No Excoriation: No Ecchymosis: No Induration: No Erythema: No Rash: No Hemosiderin Staining: No Scarring: No Mottled: No Pallor: No Moisture Rubor: No No Abnormalities Noted: No Dry / Scaly: No Temperature / Pain Maceration: No Temperature: No Abnormality Wound Preparation Ulcer Cleansing: Rinsed/Irrigated with Saline Topical Anesthetic Applied: Other: lidocaine 4%, Electronic Signature(s) Signed: 02/08/2017 5:44:12 PM By: Elliot Gurney, BSN, RN, CWS, Kim RN, BSN Entered By: Elliot Gurney, BSN, RN, CWS, Kim on 02/08/2017 14:26:55 Theresa Jensen ([id number]) -------------------------------------------------------------------------------- Vitals Details Patient Name: Theresa Jensen Date of Service: 02/08/2017 1:45 PM Medical Record Patient Account Number: [id number] Number: Treating RN: Huel Coventry [birth year] (70 y.o. Other Clinician: Date of Birth/Sex: Female) Treating ROBSON, MICHAEL Primary Care Donata Reddick: Ruffin Pyo Bray Vickerman/Extender: G Referring Wylder Macomber: Ruffin Pyo Weeks in Treatment: 12 Vital Signs Time Taken: 14:01 Temperature (F): 97.8 Height (in): 65 Pulse (bpm): 86 Weight (lbs): 187 Respiratory Rate  (breaths/min): 16 Body Mass Index (BMI): 31.1 Blood Pressure (mmHg): 113/53 Reference Range: 80 - 120 mg / dl Electronic Signature(s) Signed: 02/08/2017 5:44:12 PM By: Elliot Gurney, BSN, RN, CWS, Kim RN, BSN Entered By: Elliot Gurney, BSN, RN, CWS, Kim on 02/08/2017 14:04:15

## 2020-03-23 ENCOUNTER — Observation Stay
Admission: EM | Admit: 2020-03-23 | Discharge: 2020-03-24 | Disposition: A | Discharge status: Home / Self Care | Payer: Medicare Other | Attending: Internal Medicine | Admitting: Internal Medicine

## 2020-03-23 ENCOUNTER — Other Ambulatory Visit: Payer: Self-pay

## 2020-03-23 ENCOUNTER — Observation Stay: Payer: Medicare Other

## 2020-03-23 ENCOUNTER — Emergency Department: Payer: Medicare Other

## 2020-03-23 DIAGNOSIS — R270 Ataxia, unspecified: Secondary | ICD-10-CM | POA: Diagnosis not present

## 2020-03-23 DIAGNOSIS — Z7982 Long term (current) use of aspirin: Secondary | ICD-10-CM | POA: Diagnosis not present

## 2020-03-23 DIAGNOSIS — I639 Cerebral infarction, unspecified: Secondary | ICD-10-CM | POA: Diagnosis not present

## 2020-03-23 DIAGNOSIS — E1142 Type 2 diabetes mellitus with diabetic polyneuropathy: Secondary | ICD-10-CM

## 2020-03-23 DIAGNOSIS — E119 Type 2 diabetes mellitus without complications: Secondary | ICD-10-CM | POA: Insufficient documentation

## 2020-03-23 DIAGNOSIS — Z20822 Contact with and (suspected) exposure to covid-19: Secondary | ICD-10-CM | POA: Diagnosis not present

## 2020-03-23 DIAGNOSIS — G459 Transient cerebral ischemic attack, unspecified: Secondary | ICD-10-CM | POA: Diagnosis not present

## 2020-03-23 DIAGNOSIS — R2689 Other abnormalities of gait and mobility: Secondary | ICD-10-CM

## 2020-03-23 DIAGNOSIS — I10 Essential (primary) hypertension: Secondary | ICD-10-CM | POA: Insufficient documentation

## 2020-03-23 DIAGNOSIS — R4701 Aphasia: Secondary | ICD-10-CM | POA: Diagnosis present

## 2020-03-23 DIAGNOSIS — R4781 Slurred speech: Secondary | ICD-10-CM | POA: Diagnosis not present

## 2020-03-23 DIAGNOSIS — M6281 Muscle weakness (generalized): Secondary | ICD-10-CM | POA: Diagnosis not present

## 2020-03-23 LAB — COMPREHENSIVE METABOLIC PANEL
ALT: 14 U/L (ref 0–44)
AST: 21 U/L (ref 15–41)
Albumin: 3.4 g/dL — ABNORMAL LOW (ref 3.5–5.0)
Alkaline Phosphatase: 75 U/L (ref 38–126)
Anion gap: 11 (ref 5–15)
BUN: 19 mg/dL (ref 8–23)
CO2: 32 mmol/L (ref 22–32)
Calcium: 9.1 mg/dL (ref 8.9–10.3)
Chloride: 99 mmol/L (ref 98–111)
Creatinine, Ser: 1.33 mg/dL — ABNORMAL HIGH (ref 0.44–1.00)
GFR, Estimated: 43 mL/min — ABNORMAL LOW (ref >60)
Glucose, Bld: 136 mg/dL — ABNORMAL HIGH (ref 70–99)
Potassium: 4.2 mmol/L (ref 3.5–5.1)
Sodium: 142 mmol/L (ref 135–145)
Total Bilirubin: 0.6 mg/dL (ref 0.3–1.2)
Total Protein: 7.2 g/dL (ref 6.5–8.1)

## 2020-03-23 LAB — DIFFERENTIAL
Abs Immature Granulocytes: 0.06 10*3/uL (ref 0.00–0.07)
Basophils Absolute: 0 10*3/uL (ref 0.0–0.1)
Basophils Relative: 0 %
Eosinophils Absolute: 0.2 10*3/uL (ref 0.0–0.5)
Eosinophils Relative: 2 %
Immature Granulocytes: 1 %
Lymphocytes Relative: 30 %
Lymphs Abs: 3.6 10*3/uL (ref 0.7–4.0)
Monocytes Absolute: 0.8 10*3/uL (ref 0.1–1.0)
Monocytes Relative: 6 %
Neutro Abs: 7.3 10*3/uL (ref 1.7–7.7)
Neutrophils Relative %: 61 %

## 2020-03-23 LAB — CBC
HCT: 36 % (ref 36.0–46.0)
Hemoglobin: 11.6 g/dL — ABNORMAL LOW (ref 12.0–15.0)
MCH: 30.2 pg (ref 26.0–34.0)
MCHC: 32.2 g/dL (ref 30.0–36.0)
MCV: 93.8 fL (ref 80.0–100.0)
Platelets: 243 10*3/uL (ref 150–400)
RBC: 3.84 MIL/uL — ABNORMAL LOW (ref 3.87–5.11)
RDW: 13.1 % (ref 11.5–15.5)
WBC: 12 10*3/uL — ABNORMAL HIGH (ref 4.0–10.5)
nRBC: 0 % (ref 0.0–0.2)

## 2020-03-23 LAB — RESP PANEL BY RT-PCR (FLU A&B, COVID) ARPGX2
Influenza A by PCR: NEGATIVE
Influenza B by PCR: NEGATIVE
SARS Coronavirus 2 by RT PCR: NEGATIVE

## 2020-03-23 LAB — APTT: aPTT: 30 seconds (ref 24–36)

## 2020-03-23 LAB — PROTIME-INR
INR: 0.9 (ref 0.8–1.2)
Prothrombin Time: 11.9 seconds (ref 11.4–15.2)

## 2020-03-23 LAB — ETHANOL: Alcohol, Ethyl (B): <10 mg/dL (ref <10)

## 2020-03-23 MED ORDER — PRAVASTATIN SODIUM 40 MG PO TABS
40.0000 mg | ORAL_TABLET | Freq: Every day | ORAL | Status: DC
  Administered 2020-03-24: 40 mg via ORAL
  Filled 2020-03-23 (×2): qty 1
  Filled 2020-03-23: qty 2

## 2020-03-23 MED ORDER — ACETAMINOPHEN 325 MG PO TABS: 650.0000 mg | ORAL_TABLET | ORAL | Status: DC | PRN

## 2020-03-23 MED ORDER — ENOXAPARIN SODIUM 60 MG/0.6ML ~~LOC~~ SOLN
45.0000 mg | SUBCUTANEOUS | Status: DC
  Administered 2020-03-24: 10:00:00 45 mg via SUBCUTANEOUS
  Filled 2020-03-23: qty 0.6

## 2020-03-23 MED ORDER — ACETAMINOPHEN 160 MG/5ML PO SOLN
650.0000 mg | ORAL | Status: DC | PRN
  Filled 2020-03-23: qty 20.3

## 2020-03-23 MED ORDER — LIRAGLUTIDE 18 MG/3ML ~~LOC~~ SOPN: 1.2000 mg | PEN_INJECTOR | Freq: Every day | SUBCUTANEOUS | Status: DC

## 2020-03-23 MED ORDER — TRAZODONE HCL 100 MG PO TABS
100.0000 mg | ORAL_TABLET | Freq: Every day | ORAL | Status: DC
  Administered 2020-03-24: 100 mg via ORAL
  Filled 2020-03-23 (×3): qty 1

## 2020-03-23 MED ORDER — ADULT MULTIVITAMIN W/MINERALS CH
1.0000 | ORAL_TABLET | Freq: Every day | ORAL | Status: DC
  Administered 2020-03-24: 1 via ORAL
  Filled 2020-03-23: qty 1

## 2020-03-23 MED ORDER — ASPIRIN EC 81 MG PO TBEC
81.0000 mg | DELAYED_RELEASE_TABLET | Freq: Every day | ORAL | Status: DC
  Administered 2020-03-24: 10:00:00 81 mg via ORAL
  Filled 2020-03-23: qty 1

## 2020-03-23 MED ORDER — GABAPENTIN 300 MG PO CAPS
900.0000 mg | ORAL_CAPSULE | Freq: Two times a day (BID) | ORAL | Status: DC
  Administered 2020-03-24 (×2): 900 mg via ORAL
  Filled 2020-03-23 (×2): qty 3

## 2020-03-23 MED ORDER — DULOXETINE HCL 60 MG PO CPEP
60.0000 mg | ORAL_CAPSULE | Freq: Every day | ORAL | Status: DC
  Administered 2020-03-24: 60 mg via ORAL
  Filled 2020-03-23: qty 1
  Filled 2020-03-23: qty 2

## 2020-03-23 MED ORDER — SODIUM CHLORIDE 0.9 % IV SOLN: INTRAVENOUS | Status: DC

## 2020-03-23 MED ORDER — ESTROGENS CONJUGATED 0.45 MG PO TABS
0.4500 mg | ORAL_TABLET | Freq: Every day | ORAL | Status: DC
  Administered 2020-03-24: 0.45 mg via ORAL
  Filled 2020-03-23: qty 1

## 2020-03-23 MED ORDER — CALCIUM CITRATE-VITAMIN D 500-500 MG-UNIT PO CHEW
1.0000 | CHEWABLE_TABLET | Freq: Every day | ORAL | Status: DC
  Administered 2020-03-24: 14:00:00 1 via ORAL
  Filled 2020-03-23: qty 1

## 2020-03-23 MED ORDER — STROKE: EARLY STAGES OF RECOVERY BOOK: Freq: Once | Status: DC

## 2020-03-23 MED ORDER — ACETAMINOPHEN 650 MG RE SUPP: 650.0000 mg | RECTAL | Status: DC | PRN

## 2020-03-23 MED ORDER — SENNOSIDES-DOCUSATE SODIUM 8.6-50 MG PO TABS: 1.0000 | ORAL_TABLET | Freq: Every evening | ORAL | Status: DC | PRN

## 2020-03-23 MED ORDER — ASPIRIN 81 MG PO CHEW
324.0000 mg | CHEWABLE_TABLET | Freq: Once | ORAL | Status: AC
  Administered 2020-03-23: 324 mg via ORAL
  Filled 2020-03-23: qty 4

## 2020-03-23 NOTE — ED Notes (Signed)
Call to [facility], lab, for status of COVID nasal swab, lab reports no swab in area, EMR shows collected at 2200

## 2020-03-23 NOTE — Consult Note (Signed)
[facility]rology Consult Services   Date of Service:   03/23/2020 21:04:16  Impression:     .  R47.81 - Slurred speech  Comments/Sign-Out: 74 yo female with history of HLD, DM, polyneuropathy, CKD, depression, RLS presenting to the ED with worsening gait instability and dysarthria. LKN 1530. NIHSS 1 for dysarthria. CT Head negative for acute findings. Concern for possible posterior circulation infarct. Would obtain MRI Brain for further evaluation.  Metrics: Last Known Well: 03/23/2020 10:00:00 [facility] Notification Time: 03/23/2020 21:03:56 Arrival Time: 03/23/2020 18:20:00 Stamp Time: 03/23/2020 21:04:16 Time First Login Attempt: 03/23/2020 21:05:13 Symptoms: generalized weakness, slurred speech. NIHSS Start Assessment Time: 03/23/2020 21:12:11 Patient is not a candidate for Thrombolytic. Thrombolytic Medical Decision: 03/23/2020 21:11:56 Patient was not deemed candidate for Thrombolytic because of following reasons: Last Well Known Above 4.5 Hours.  CT head showed no acute hemorrhage or acute core infarct.  ED Physician notified of diagnostic impression and management plan on 03/23/2020 21:53:00  Advanced Imaging: Advanced Imaging Not Recommended because:  Clinical Presentation is not Suggestive of LVO and NIHSS is <6   Our recommendations are outlined below.  Recommendations:     .  Activate Stroke Protocol Admission/Order Set     .  Stroke/Telemetry Floor     .  Neuro Checks     .  Bedside Swallow Eval     .  DVT Prophylaxis     .  IV Fluids, Normal Saline     .  Head of Bed 30 Degrees     .  Euglycemia and Avoid Hyperthermia (PRN Acetaminophen)     .  Continue ASA  Routine Consultation with Inhouse Neurology for Follow up Care  Sign Out:     .  Discussed with Emergency Department Provider    ------------------------------------------------------------------------------  History of Present Illness: Patient is a 74 year  old Female.  Patient was brought by EMS for symptoms of generalized weakness, slurred speech.  Patient had a fall at 1030 this morning on to her R side. Has gait instability at baseline that seemed to be worse today. She took at nap at 1400 and when she woke up at 1500 her housekeeper noticed slurred speech. Denies any dizziness or dysphagia. No UE involvement.    Past Medical History:     . Diabetes Mellitus     . Hyperlipidemia     . Polyneuropathy, CKD, depression, RLS, vestibular equilibrium  Social History:Unable to obtain due to Patient Status  Family History:Reviewed  Review of System:  14 Points Review of Systems was performed and was negative except mentioned in HPI.  Anticoagulant use:  No  Antiplatelet use: Yes ASA 81 mg  Allergies:  Reviewed     Examination: BP(150/74), Pulse(93), Blood Glucose(136) 1A: Level of Consciousness - Alert; keenly responsive + 0 1B: Ask Month and Age - Both Questions Right + 0 1C: Blink Eyes & Squeeze Hands - Performs Both Tasks + 0 2: Test Horizontal Extraocular Movements - Normal + 0 3: Test Visual Fields - No Visual Loss + 0 4: Test Facial Palsy (Use Grimace if Obtunded) - Normal symmetry + 0 5A: Test Left Arm Motor Drift - No Drift for 10 Seconds + 0 5B: Test Right Arm Motor Drift - No Drift for 10 Seconds + 0 6A: Test Left Leg Motor Drift - No Drift for 5 Seconds + 0 6B: Test Right Leg Motor Drift - No Drift for 5 Seconds + 0 7: Test Limb Ataxia (FNF/Heel-Shin) - No Ataxia +  0 8: Test Sensation - Normal; No sensory loss + 0 9: Test Language/Aphasia - Normal; No aphasia + 0 10: Test Dysarthria - Mild-Moderate Dysarthria: Slurring but can be understood + 1 11: Test Extinction/Inattention - No abnormality + 0  NIHSS Score: 1  Pre-Morbid Modified Rankin Scale: 0 Points = No symptoms at all   Patient/Family was informed the Neurology Consult would occur via TeleHealth consult by way of interactive audio and video  telecommunications and consented to receiving care in this manner.   Patient is being evaluated for possible acute neurologic impairment and high probability of imminent or life-threatening deterioration. I spent total of 30 minutes providing care to this patient, including time for face to face visit via telemedicine, review of medical records, imaging studies and discussion of findings with providers, the patient and/or family.   Dr Harrie Jeans   [facility] [phone]  Case [id number]

## 2020-03-23 NOTE — ED Notes (Signed)
Floor called and requested for covid swab to be resulted prior to pt being transported to floor. Charge made aware. Pt states she received a covid swab prior to this RN assuming care. Per chart, covid swab was collected. However, when called lab, lab states they do not have the specimen. Pt at MRI at this time.

## 2020-03-23 NOTE — ED Provider Notes (Signed)
[facility] Emergency Department Provider Note   ____________________________________________   I have reviewed the triage vital signs and the nursing notes.   HISTORY  Chief Complaint Fall and Aphasia   History limited by: Not Limited   HPI Theresa Jensen is a 74 y.o. female who presents to the emergency department today because of concerns for slurred speech as well as increased imbalance.  The patient states that she has history of chronic balance issues.  Upon awakening this morning she noticed that her balance was worse than normal.  She did suffer a fall this morning onto her right side.  The patient denies hitting her head.  She then took a nap.  Around 3:00 apparently she was speaking to someone when they noticed that her speech was slurred.  The patient denies any headaches.   Records reviewed. Per medical record review patient has a history of dizziness and lightheadedness.   Prior to Admission medications   Not on File    Allergies Patient has no known allergies.  No family history on file.  Social History Social History   Tobacco Use  . Smoking status: Not on file  Substance Use Topics  . Alcohol use: Not on file  . Drug use: Not on file    Review of Systems Constitutional: No fever/chills Eyes: No visual changes. ENT: No sore throat. Cardiovascular: Denies chest pain. Respiratory: Denies shortness of breath. Gastrointestinal: No abdominal pain.  No nausea, no vomiting.  No diarrhea.   Genitourinary: Negative for dysuria. Musculoskeletal: Positive for right leg pain. Skin: Negative for rash. Neurological: Positive for slurred speech, dizziness. ____________________________________________   PHYSICAL EXAM:  VITAL SIGNS: ED Triage Vitals [03/23/20 1827]  Enc Vitals Group     BP 136/86     Pulse Rate 95     Resp 18     Temp 98.6 F (37 C)     Temp Source Oral     SpO2 98 %     Weight 200 lb (90.7 kg)     Height 5'  3" (1.6 m)     Head Circumference      Peak Flow      Pain Score 5   Constitutional: Alert with some slight confusion. Eyes: Conjunctivae are normal.  ENT      Head: Normocephalic and atraumatic.      Nose: No congestion/rhinnorhea.      Mouth/Throat: Mucous membranes are moist.      Neck: No stridor. Hematological/Lymphatic/Immunilogical: No cervical lymphadenopathy. Cardiovascular: Normal rate, regular rhythm.  No murmurs, rubs, or gallops.  Respiratory: Normal respiratory effort without tachypnea nor retractions. Breath sounds are clear and equal bilaterally. No wheezes/rales/rhonchi. Gastrointestinal: Soft and non tender. No rebound. No guarding.  Genitourinary: Deferred Musculoskeletal: Normal range of motion in all extremities. No lower extremity edema. Neurologic:  Slight confusion and slurred speech. Strength 5/5 in all extremities. Sensation grossly intact. Slight tremor to  Skin:  Skin is warm, dry and intact. No rash noted. Psychiatric: Mood and affect are normal. Speech and behavior are normal. Patient exhibits appropriate insight and judgment.  ____________________________________________    LABS (pertinent positives/negatives)  Ethanol <10 CMP wnl except glu 136, cr 1.33, alb 3.4,  CBC wbc 12.0, hgb 11.6, plt 243  ____________________________________________   EKG  I, Phineas Semen, attending physician, personally viewed and interpreted this EKG  EKG Time: 1835 Rate: 94 Rhythm: sinus rhythm Axis: normal Intervals: qtc 484 QRS: narrow, q waves v1, v2, v3, III, avf ST changes:  no st elevation Impression: abnormal ekg   ____________________________________________    RADIOLOGY  CT head No acute abnormality  ____________________________________________   PROCEDURES  Procedures  ____________________________________________   INITIAL IMPRESSION / ASSESSMENT AND PLAN / ED COURSE  Pertinent labs & imaging results that were available during  my care of the patient were reviewed by me and considered in my medical decision making (see chart for details).   Patient presented to the emergency department today because of concern for slurred speech and worsening ambulation.  She states that she has a baseline hard time with her balance although noticed that it was worse today.  Concern for stroke code stroke was called.  Patient was evaluated by neurology.  Patient was outside of the window for TPA.  However given the slurred speech do have concerns for CVA.  Will plan on admission for stroke work-up.  ____________________________________________   FINAL CLINICAL IMPRESSION(S) / ED DIAGNOSES  Final diagnoses:  Slurred speech  Imbalance     Note: This dictation was prepared with Dragon dictation. Any transcriptional errors that result from this process are unintentional     Phineas Semen, MD 03/23/20 2258

## 2020-03-23 NOTE — ED Notes (Signed)
Assumed care of pt at 2300. Denies pain at this time. Speech is slurred, RUE and RLE +4 noted vs LUE/LLE +5. AO x4. Visitor at bedside. Talking in full sentences with regular and unlabored breathing. Sitting in chair vs litter for increased comfort. Call bell placed within reach

## 2020-03-23 NOTE — H&P (Signed)
[place]   PATIENT NAME: Theresa Jensen    MR#:  [id number]  DATE OF BIRTH:  [birth year]  DATE OF ADMISSION:  03/23/2020  PRIMARY CARE PHYSICIAN: Thomes Dinning, MD   REQUESTING/REFERRING PHYSICIAN: Phineas Semen, MD  CHIEF COMPLAINT:   Chief Complaint  Patient presents with  . Fall  . Aphasia    HISTORY OF PRESENT ILLNESS:  Theresa Jensen  is a 74 y.o. female with a known history of type 2 diabetes mellitus, hypertension, peripheral neuropathy and recent unsteady gait, who presented to the emergency room with acute onset of fall this morning.  The patient has been having gait instability for a while.  His neighbor and healthcare power of attorney who was with her in the emergency room.  She had no significant injury secondary to her fall.  She was able to walk after her fall with some assistance.  She then developed slurred speech and mild right facial droop.  She admits to dizziness and vertigo with no tinnitus.  No paresthesias or other focal muscle weakness.  No nausea or vomiting or diarrhea or abdominal pain.  She denied any chest pain or palpitations.  No cough or wheezing or hemoptysis.  Upon presentation to the emergency room, vital signs were within normal.  Labs revealed a creatinine of 1.33 and CBC with mild leukocytosis of 12 with mild anemia.  Influenza antigens and COVID-19 PCR came back negative.  Alcohol level was less than 10.  Noncontrasted CT scan showed no acute intracranial normalities.  It showed age indeterminate small vessel infarct of the left centrum semiovale. EKG showed no sinus rhythm with rate of 94 with Q waves anteroseptally and inferiorly.Brain MRI showed 1.8 cm acute ischemic menorrhagic lacunar type infarct involving the left basal ganglia with underlying age-related cerebral atrophy and mild chronic small vessel ischemic disease.  MRA was negative for large vessel occlusion and it showed mild intracranial atherosclerotic change with no  proximal high-grade or correctable stenosis identified.  The patient had a recent carotid Doppler at [facility] that showed: Right Carotid: Plaque noted in the ECA. The extracranial vessels were         near-normal with only minimal wall thickening or plaque.  Left Carotid: Non-hemodynamically significant plaque noted in the CCA. The        extracranial vessels were near-normal with only minimal wall        thickening or plaque.  Vertebrals: Both vertebral arteries were patent with antegrade flow.  Subclavians: Normal flow hemodynamics were seen in bilateral subclavian        arteries.  She takes aspirin at home and was given 4 of aspirin here.  She will be admitted to an observation medical monitored bed for further evaluation and management.   PAST MEDICAL HISTORY:  Type 2 diabetes mellitus, dyslipidemia, peripheral neuropathy, unsteady gait.  PAST SURGICAL HISTORY:  Hysterectomy, bilateral eye surgery, dental surgery, appendectomy.  SOCIAL HISTORY:   Social History   Tobacco Use  . Smoking status: Not on file  Substance Use Topics  . Alcohol use: Not on file  She denies any history of tobacco, EtOH abuse or illicit drug use.  FAMILY HISTORY:  Positive for diabetes mellitus in her father.  DRUG ALLERGIES:  No Known Allergies  REVIEW OF SYSTEMS:   ROS As per history of present illness. All pertinent systems were reviewed above. Constitutional, HEENT, cardiovascular, respiratory, GI, GU, musculoskeletal, neuro, psychiatric, endocrine, integumentary and hematologic systems were reviewed and are otherwise negative/unremarkable  except for positive findings mentioned above in the HPI.   MEDICATIONS AT HOME:   Prior to Admission medications   Medication Sig Start Date End Date Taking? Authorizing Provider  aspirin 81 MG EC tablet Take 81 mg by mouth daily.    [provider]  calcium citrate-vitamin D (CITRACAL+D) 315-200 MG-UNIT tablet  Take 1 tablet by mouth daily.    [provider]  DULoxetine (CYMBALTA) 60 MG capsule Take 60 mg by mouth daily. 12/29/19   [provider]  gabapentin (NEURONTIN) 300 MG capsule Take 900 mg by mouth 2 (two) times daily.  03/06/20   [provider]  Multiple Vitamin (MULTIVITAMIN ADULT) TABS Take 1 tablet by mouth daily.    [provider]  pravastatin (PRAVACHOL) 40 MG tablet Take 40 mg by mouth at bedtime. 12/20/19   [provider]  PREMARIN 0.45 MG tablet Take 0.45 mg by mouth daily. 03/13/20   [provider]  traZODone (DESYREL) 50 MG tablet Take 100 mg by mouth at bedtime. 02/24/20   [provider]  VICTOZA 18 MG/3ML SOPN Inject 1.2 mg into the skin daily. 02/04/20   [provider]      VITAL SIGNS:  Blood pressure (!) 144/77, pulse 98, temperature 98.6 F (37 C), temperature source Oral, resp. rate (!) 27, height 5\' 3"  (1.6 m), weight 90.7 kg, SpO2 97 %.  PHYSICAL EXAMINATION:  Physical Exam  GENERAL:  74 y.o.-year-old Caucasian female patient lying in the bed with no acute distress.  EYES: Pupils equal, round, reactive to light and accommodation. No scleral icterus. Extraocular muscles intact.  HEENT: Head atraumatic, normocephalic. Oropharynx and nasopharynx clear.  NECK:  Supple, no jugular venous distention. No thyroid enlargement, no tenderness.  LUNGS: Normal breath sounds bilaterally, no wheezing, rales,rhonchi or crepitation. No use of accessory muscles of respiration.  CARDIOVASCULAR: Regular rate and rhythm, S1, S2 normal. No murmurs, rubs, or gallops.  ABDOMEN: Soft, nondistended, nontender. Bowel sounds present. No organomegaly or mass.  EXTREMITIES: No pedal edema, cyanosis, or clubbing.  NEUROLOGIC: Cranial nerves II through XII are intact except for minimal slurred speech. Muscle strength 5/5 in all extremities. Sensation intact. Gait not checked.  PSYCHIATRIC: The patient is alert and oriented x  3.  Normal affect and good eye contact. SKIN: No obvious rash, lesion, or ulcer.   LABORATORY PANEL:   CBC Recent Labs  Lab 03/23/20 1908  WBC 12.0*  HGB 11.6*  HCT 36.0  PLT 243   ------------------------------------------------------------------------------------------------------------------  Chemistries  Recent Labs  Lab 03/23/20 1908  NA 142  K 4.2  CL 99  CO2 32  GLUCOSE 136*  BUN 19  CREATININE 1.33*  CALCIUM 9.1  AST 21  ALT 14  ALKPHOS 75  BILITOT 0.6   ------------------------------------------------------------------------------------------------------------------  Cardiac Enzymes No results for input(s): TROPONINI in the last 168 hours. ------------------------------------------------------------------------------------------------------------------  RADIOLOGY:  CT HEAD CODE STROKE WO CONTRAST  Result Date: 03/23/2020 CLINICAL DATA:  Code stroke.  Slurred speech EXAM: CT HEAD WITHOUT CONTRAST TECHNIQUE: Contiguous axial images were obtained from the base of the skull through the vertex without intravenous contrast. COMPARISON:  None. FINDINGS: Brain: There is no acute intracranial hemorrhage, mass effect, or edema. Gray-white differentiation is preserved. Age-indeterminate small vessel infarct of the left centrum semiovale. Additional patchy hypoattenuation in the supratentorial white matter is nonspecific but may reflect minor chronic microvascular ischemic changes. Ventricles and sulci are within normal limits in size and configuration. There is no extra-axial collection. Vascular: No hyperdense vessel.  There is intracranial atherosclerotic calcification at the skull base. Skull: Unremarkable. Sinuses/Orbits: Aerated.  No significant abnormality of the orbits. Other: Mastoid air cells are clear. ASPECTS (Alberta Stroke Program Early CT Score) - Ganglionic level infarction (caudate, lentiform nuclei, internal capsule, insula, M1-M3 cortex): 7 - Supraganglionic  infarction (M4-M6 cortex): 3 Total score (0-10 with 10 being normal): 10 IMPRESSION: There is no acute intracranial hemorrhage or evidence of acute infarction. ASPECT score is 10. Age-indeterminate small vessel infarct of the left centrum semiovale. These results were called by telephone at the time of interpretation on 03/23/2020 at 6:58 pm to provider [facility] , who verbally acknowledged these results. Electronically Signed   By: Guadlupe Spanish M.D.   On: 03/23/2020 19:01      IMPRESSION AND PLAN:   1.  Acute left basal ganglia infarct.  The patient's neurological deficit including slurred speech and right facial droop have significant improved.  She has associated ataxia. -The patient will be admitted to an observation medical monitored bed. -We will follow neuro checks every 4 hours for 24 hours. -We will add Plavix or aspirin. -2D echo with bubble study will be obtained. -Neurology consult will be obtained. -I notified Dr. Loretha Brasil about the patient. -PT/OT and ST consults will be obtained. -We will check fasting lipids in a.m. and continue her on statin therapy.  2.  Type 2 diabetes mellitus with peripheral neuropathy. -We will place her on supplemental coverage with NovoLog and Neurontin. -We will hold off Victoza for now.  3.  Dyslipidemia. -We will continue statin therapy.  4.  DVT prophylaxis. -Subcutaneous Lovenox.   All the records are reviewed and case discussed with ED provider. The plan of care was discussed in details with the patient (and family). I answered all questions. The patient agreed to proceed with the above mentioned plan. Further management will depend upon hospital course.   CODE STATUS: Full code  Status is: Observation  The patient remains OBS appropriate and will d/c before 2 midnights.  Dispo: The patient is from: Home              Anticipated d/c is to: Home              Anticipated d/c date is: 1 day              Patient currently  is not medically stable to d/c.    TOTAL TIME TAKING CARE OF THIS PATIENT: 55 minutes.    Hannah Beat M.D on 03/23/2020 at 11:11 PM  [facility]   From 7 PM-7 AM, contact night-coverage [email or web address]  CC: Primary care physician; Thomes Dinning, MD

## 2020-03-23 NOTE — ED Notes (Signed)
Called code stroke to [phone] per dr. Derrill Kay

## 2020-03-23 NOTE — ED Triage Notes (Addendum)
Pt presents to the [facility] via EMS from home with c/o slurred speech and fall. EMS states that pt was walking in hall when she lost her balance and fell onto right side at 1030 this morning. Pt currently c/o pain to right hip and knee. Pt states that she has had ongoing balance problems for several weeks but is worse than normal today. Pt had to be assisted to her feet but was able to ambulate afterwards. Pt then fell asleep at 1300 and awoke at 1500. Pt states that her housekeeper came to her house who noticed that she had slurred speech. Pt currently alert and oriented x 4 with no other symptoms at this time.

## 2020-03-23 NOTE — ED Notes (Signed)
Call back from lab, specimen was ran and resulted but couldn't be charted d/t [facility]

## 2020-03-23 NOTE — ED Notes (Signed)
Report called to [place], Charity fundraiser. Pt at MRI, awaiting transport until pt is back on unit

## 2020-03-24 ENCOUNTER — Encounter: Payer: Self-pay | Admitting: Family Medicine

## 2020-03-24 ENCOUNTER — Observation Stay
Admit: 2020-03-24 | Discharge: 2020-03-24 | Disposition: A | Discharge status: Home / Self Care | Payer: Medicare Other | Attending: Internal Medicine | Admitting: Internal Medicine

## 2020-03-24 DIAGNOSIS — R4781 Slurred speech: Secondary | ICD-10-CM | POA: Diagnosis not present

## 2020-03-24 DIAGNOSIS — G459 Transient cerebral ischemic attack, unspecified: Secondary | ICD-10-CM | POA: Diagnosis not present

## 2020-03-24 LAB — HEMOGLOBIN A1C
Hgb A1c MFr Bld: 6.4 % — ABNORMAL HIGH (ref 4.8–5.6)
Mean Plasma Glucose: 136.98 mg/dL

## 2020-03-24 LAB — ECHOCARDIOGRAM COMPLETE
AR max vel: 1.94 cm2
AV Area VTI: 2.44 cm2
AV Area mean vel: 2.03 cm2
AV Mean grad: 3 mmHg
AV Peak grad: 5.1 mmHg
Ao pk vel: 1.13 m/s
Area-P 1/2: 3.37 cm2
Height: 63 in
S' Lateral: 2.71 cm
Weight: 3200 oz

## 2020-03-24 LAB — LIPID PANEL
Cholesterol: 193 mg/dL (ref 0–200)
HDL: 47 mg/dL (ref >40)
LDL Cholesterol: 101 mg/dL — ABNORMAL HIGH (ref 0–99)
Total CHOL/HDL Ratio: 4.1 RATIO
Triglycerides: 223 mg/dL — ABNORMAL HIGH (ref <150)
VLDL: 45 mg/dL — ABNORMAL HIGH (ref 0–40)

## 2020-03-24 LAB — GLUCOSE, CAPILLARY
Glucose-Capillary: 153 mg/dL — ABNORMAL HIGH (ref 70–99)
Glucose-Capillary: 169 mg/dL — ABNORMAL HIGH (ref 70–99)

## 2020-03-24 MED ORDER — ATORVASTATIN CALCIUM 40 MG PO TABS: 40.0000 mg | ORAL_TABLET | Freq: Every day | ORAL | 1 refills | Status: AC

## 2020-03-24 MED ORDER — INSULIN ASPART 100 UNIT/ML ~~LOC~~ SOLN
0.0000 [IU] | Freq: Three times a day (TID) | SUBCUTANEOUS | Status: DC
  Administered 2020-03-24 (×2): 2 [IU] via SUBCUTANEOUS
  Filled 2020-03-24 (×2): qty 1

## 2020-03-24 MED ORDER — CLOPIDOGREL BISULFATE 75 MG PO TABS: 75.0000 mg | ORAL_TABLET | Freq: Every day | ORAL | 0 refills | Status: AC

## 2020-03-24 MED ORDER — CLOPIDOGREL BISULFATE 75 MG PO TABS
75.0000 mg | ORAL_TABLET | Freq: Every day | ORAL | Status: DC
  Administered 2020-03-24: 10:00:00 75 mg via ORAL
  Filled 2020-03-24: qty 1

## 2020-03-24 NOTE — Progress Notes (Signed)
*  PRELIMINARY RESULTS* Echocardiogram 2D Echocardiogram has been performed.  Cristela Blue 03/24/2020, 8:44 AM

## 2020-03-24 NOTE — TOC Initial Note (Signed)
Transition of Care [facility]) - Initial/Assessment Note    Patient Details  Name: Theresa Jensen MRN: [id number] Date of Birth: [birth year]  [facility] of [facility]) CM/SW Contact:    Liliana Cline, LCSW Phone Number: 03/24/2020, 11:17 AM  Clinical Narrative:         PT recommending OPPT and RW. CSW spoke to patient. Patient lives alone and drives herself to appointments. PCP is Dr. Tania Ade. Pharmacy is [facility] in [place]. Patient has a RW, knee scooter, and BSC at home. Patient reported she goes to Outpatient PT already in [place] and has a RW to use if needed. Patient reported she has a ride home at discharge. Denied additional needs at this time.          Expected Discharge Plan: OP Rehab Barriers to Discharge: Continued Medical Work up   Patient Goals and CMS Choice Patient states their goals for this hospitalization and ongoing recovery are:: Outpatient Rehab CMS Medicare.gov Compare Post Acute Care list provided to:: Patient Choice offered to / list presented to : Patient  Expected Discharge Plan and Services Expected Discharge Plan: OP Rehab       Living arrangements for the past 2 months: Single Family Home                                      Prior Living Arrangements/Services Living arrangements for the past 2 months: Single Family Home Lives with:: Self Patient language and need for interpreter reviewed:: Yes Do you feel safe going back to the place where you live?: Yes      Need for Family Participation in Patient Care: Yes (Comment) Care giver support system in place?: Yes (comment) Current home services: DME Criminal Activity/Legal Involvement Pertinent to Current Situation/Hospitalization: No - Comment as needed  Activities of Daily Living Home Assistive Devices/Equipment: Eyeglasses ADL Screening (condition at time of admission) Patient's cognitive ability adequate to safely complete daily activities?: Yes Is the patient deaf or have difficulty  hearing?: No Does the patient have difficulty seeing, even when wearing glasses/contacts?: No Does the patient have difficulty concentrating, remembering, or making decisions?: No Patient able to express need for assistance with ADLs?: Yes Does the patient have difficulty dressing or bathing?: No Independently performs ADLs?: No Communication: Independent Dressing (OT): Independent Grooming: Independent Feeding: Independent Bathing: Independent Toileting: Needs assistance In/Out Bed: Needs assistance Does the patient have difficulty walking or climbing stairs?: Yes Weakness of Legs: None Weakness of Arms/Hands: None  Permission Sought/Granted Permission sought to share information with : Oceanographer granted to share information with : Yes, Verbal Permission Granted     Permission granted to share info w AGENCY: OP rehab        Emotional Assessment       Orientation: : Oriented to Self, Oriented to Situation, Oriented to  Time, Oriented to Place Alcohol / Substance Use: Not Applicable Psych Involvement: No (comment)  Admission diagnosis:  TIA (transient ischemic attack) [G45.9] Slurred speech [R47.81] Imbalance [R26.89] Patient Active Problem List   Diagnosis Date Noted  . TIA (transient ischemic attack) 03/23/2020   PCP:  Thomes Dinning, MD Pharmacy:  No Pharmacies Listed    Social Determinants of Health (SDOH) Interventions    Readmission Risk Interventions No flowsheet data found.

## 2020-03-24 NOTE — Evaluation (Signed)
Physical Therapy Evaluation Patient Details Name: Theresa Jensen MRN: [id number] DOB: 1945/11/12 Today's Date: 03/24/2020   History of Present Illness  Pt is admitted for positive CVA in L BG with complaints of slurred speech and fall on R side. History includes HLD, DM, polyneuropathy, CKD, and depression.   Clinical Impression  Pt is a pleasant 74 year old female who was admitted for CVA. Pt performs bed mobility with cga, transfers with mod I, and ambulation with cga. Pt tends to furniture cruise reaching out with L hand. Reports chronic numbness in B hands/feet. Decreased coordination noted on R hand along with decreased strength in R hemibody, slightly compared to L side. Tends to drag R foot during ambulation although doesn't appear to be tripping hazard. Continue to recommend use of AD for improved gait mechanics and decreased fall risk. Pt demonstrates deficits with safety awareness/endurance/mobility. Would benefit from skilled PT to address above deficits and promote optimal return to PLOF. Recommend to continue with OP PT    Follow Up Recommendations Outpatient PT;Supervision for mobility/OOB    Equipment Recommendations   (reports she has RW)    Recommendations for Other Services       Precautions / Restrictions Precautions Precautions: Fall Restrictions Weight Bearing Restrictions: No      Mobility  Bed Mobility Overal bed mobility: Needs Assistance Bed Mobility: Supine to Sit     Supine to sit: Min guard     General bed mobility comments: reaches out for therapist hand during transfer, however doesn't require assistance. once seated, upright posture noted. On return back supine, struggles to adjust in bed. Is able to bridge hips and reach with B UE to use railing for scooting up in bed.    Transfers Overall transfer level: Modified independent Equipment used: None             General transfer comment: takes extended time, however no assist needed. Pushes  from seated surface and reaches out for furniture for balance. Slight dizziness noted during initial transfer  Ambulation/Gait Ambulation/Gait assistance: Min guard Gait Distance (Feet): 130 Feet Assistive device: None Gait Pattern/deviations: Step-to pattern     General Gait Details: drags R LE. Fatigues quickly. Reaches out for railing for assistance despite cues for using AD. Slow gait speed. High falls risk  Stairs            Wheelchair Mobility    Modified Rankin (Stroke Patients Only)       Balance Overall balance assessment: Needs assistance;History of Falls Sitting-balance support: Feet supported;Bilateral upper extremity supported Sitting balance-Leahy Scale: Good     Standing balance support: Single extremity supported Standing balance-Leahy Scale: Fair Standing balance comment: tends to demonstrate L Lateral shift due to R side weakness                             Pertinent Vitals/Pain Pain Assessment: No/denies pain    Home Living Family/patient expects to be discharged to:: Private residence Living Arrangements: Alone Available Help at Discharge: Neighbor;Available PRN/intermittently (does stay during the day per patient) Type of Home: House Home Access: Stairs to enter Entrance Stairs-Rails: Can reach both Entrance Stairs-Number of Steps: 3 Home Layout: Two level;Bed/bath upstairs Home Equipment: Walker - 2 wheels (in garage) Additional Comments: not forthcoming with answers    Prior Function Level of Independence: Independent         Comments: reports multiple falls and doesn't use AD. reports she has been active  with OP PT working on balance deficits.     Hand Dominance        Extremity/Trunk Assessment   Upper Extremity Assessment Upper Extremity Assessment: Generalized weakness (B UE grossly 4+/5; slightly decreased on R side)    Lower Extremity Assessment Lower Extremity Assessment: Generalized weakness (B HS 3+/5;  quad 3-/5)       Communication   Communication: Expressive difficulties (slightly slurred speech)  Cognition Arousal/Alertness: Awake/alert Behavior During Therapy: Flat affect Overall Cognitive Status: Within Functional Limits for tasks assessed                                 General Comments: impaired safety deficits, not aware of deficits      General Comments      Exercises     Assessment/Plan    PT Assessment Patient needs continued PT services  PT Problem List Decreased strength;Decreased activity tolerance;Decreased balance;Decreased mobility;Decreased coordination;Decreased safety awareness;Decreased knowledge of use of DME       PT Treatment Interventions DME instruction;Gait training;Stair training;Therapeutic activities;Therapeutic exercise;Balance training    PT Goals (Current goals can be found in the Care Plan section)  Acute Rehab PT Goals Patient Stated Goal: to go home today PT Goal Formulation: With patient Time For Goal Achievement: 04/07/20 Potential to Achieve Goals: Good    Frequency 7X/week   Barriers to discharge        Co-evaluation               AM-PAC PT "6 Clicks" Mobility  Outcome Measure Help needed turning from your back to your side while in a flat bed without using bedrails?: None Help needed moving from lying on your back to sitting on the side of a flat bed without using bedrails?: None Help needed moving to and from a bed to a chair (including a wheelchair)?: A Little Help needed standing up from a chair using your arms (e.g., wheelchair or bedside chair)?: A Little Help needed to walk in hospital room?: A Little Help needed climbing 3-5 steps with a railing? : A Little 6 Click Score: 20    End of Session Equipment Utilized During Treatment: Gait belt Activity Tolerance: Patient tolerated treatment well Patient left: in bed;with call bell/phone within reach;with bed alarm set (refuses chair) Nurse  Communication: Mobility status PT Visit Diagnosis: Unsteadiness on feet (R26.81);Muscle weakness (generalized) (M62.81);History of falling (Z91.81);Difficulty in walking, not elsewhere classified (R26.2)    Time: 3790-2409 PT Time Calculation (min) (ACUTE ONLY): 29 min   Charges:   PT Evaluation $PT Eval Low Complexity: 1 Low PT Treatments $Gait Training: 8-22 mins        Elizabeth Palau, PT, DPT [phone]   Yahya Boldman 03/24/2020, 10:58 AM

## 2020-03-24 NOTE — Progress Notes (Signed)
Anticoagulation monitoring(Lovenox):   74 yo female ordered Lovenox 40 mg Q24h  Filed Weights   03/23/20 1827  Weight: 90.7 kg (200 lb)   BMI 35.4   Lab Results  Component Value Date   CREATININE 1.33 (H) 03/23/2020   Estimated Creatinine Clearance: 40.9 mL/min (A) (by C-G formula based on SCr of 1.33 mg/dL (H)). Hemoglobin & Hematocrit     Component Value Date/Time   HGB 11.6 (L) 03/23/2020 1908   HCT 36.0 03/23/2020 1908     Per Protocol for Patient with estCrcl > 30 ml/min and BMI > 30, will transition to Lovenox 45 mg Q24h.

## 2020-03-24 NOTE — Evaluation (Addendum)
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 History includes HLD, DM, polyneuropathy, CKD, and depression.    Clinical Impression   Pt seen for OT evaluation this date in setting of acute hospitalization 2/2 stroke. Pt reports living alone and performing all I/ADLs I'ly and with no AD. Endorses several falls in last year. Pt reports her neighbor is her medical POA and checks on her after work each day. Pt is not forthcoming with some PLOF/home setup questions/data collection. On assessment this date, pt demos decreased standing balance and tolerance as well as decreased coordination/strength of R UE. Pt requires MIN/MOD A for LB dressing and MIN A/CGA for ADL transfers with RW from EOB. If pt is truly INDEP in all aspects at baseline, she is below her reported functional baseline on assessment and SNF would be safest d/c recommendation. However, pt refusing SNF. As pt's R UE coordination is currently decreased, do not anticipate that it is safest for pt to complete IADL of driving her to her appts I'ly at this time. Therefore, will recommend HHOT for pt safety, fall prevention and to improve coordination/independence with self care ADLs. Will continue to follow per OT POC. Pt tolerated well overall today with some short bouts of increased HR (to 110s) with light activity such as STS.     Follow Up Recommendations  Home health OT;Other (comment) (pt refusing SNF)    Equipment Recommendations  3 in 1 bedside commode;Tub/shower seat;Other (comment) (2WW, grab bars in restroom)    Recommendations for Other Services       Precautions / Restrictions Precautions Precautions: Fall Restrictions Weight Bearing Restrictions: No      Mobility Bed Mobility Overal bed mobility: Needs  Assistance Bed Mobility: Supine to Sit     Supine to sit: Min guard     General bed mobility comments: use of bed rail, HOB elevated    Transfers Overall transfer level: Needs assistance Equipment used: Rolling walker (2 wheeled) Transfers: Sit to/from Stand Sit to Stand: Min guard;Min assist         General transfer comment: extended time, assist for steadying and lift off, demos good control once up to static stand    Balance Overall balance assessment: Needs assistance;History of Falls Sitting-balance support: Feet supported;Bilateral upper extremity supported Sitting balance-Leahy Scale: Good     Standing balance support: Bilateral upper extremity supported Standing balance-Leahy Scale: Fair Standing balance comment: she is able to alternate UEs from RW for fxl tasks, benefits from support of at least 1 UE in standing.                           ADL either performed or assessed with clinical judgement   ADL Overall ADL's : Needs assistance/impaired                                       General ADL Comments: requires MIN/MOD A to don socks while seated EOB, SETUP for seated UB ADLs. CGA/MIN A for ADL transfers with RW.     Vision Baseline Vision/History: Wears glasses Wears Glasses: Reading only Patient Visual Report: No change from baseline       Perception  Praxis      Pertinent Vitals/Pain Pain Assessment: 0-10 Pain Score: 4  Pain Location: R hip/knee Pain Descriptors / Indicators: Sore;Tender Pain Intervention(s): Limited activity within patient's tolerance;Monitored during session     Hand Dominance Left   Extremity/Trunk Assessment Upper Extremity Assessment Upper Extremity Assessment: RUE deficits/detail;LUE deficits/detail RUE Deficits / Details: grossly 4/5 MMT, decreased coordination with finger-to-nose test for dysmetria RUE Coordination: decreased fine motor;decreased gross motor   Lower Extremity  Assessment Lower Extremity Assessment: Defer to PT evaluation       Communication Communication Communication: Expressive difficulties (slightly slurred, L facial droop)   Cognition Arousal/Alertness: Awake/alert Behavior During Therapy: Flat affect Overall Cognitive Status: Within Functional Limits for tasks assessed                                 General Comments: pt oriented, but demos decreased safety awareness/lack of insight into deficits. Requires cues for safety with RW/ADL transfers.   General Comments       Exercises Other Exercises Other Exercises: OT facilitates ed re: role of OT in acute setting, d/c recommendations including safety/fall prevention, safe use of RW, importance of OOB activity, and potential d/c needs such as rehab versus HH therapy.   Shoulder Instructions      Home Living Family/patient expects to be discharged to:: Private residence Living Arrangements: Alone Available Help at Discharge: Neighbor;Available PRN/intermittently (states that her neighbor is her medical POA and works during the day, checks on her after work.) Type of Home: House Home Access: Stairs to enter Secretary/administrator of Steps: 3 Entrance Stairs-Rails: Can reach both Home Layout: Two level;Bed/bath upstairs Alternate Level Stairs-Number of Steps: 17   Bathroom Shower/Tub: Producer, television/film/video: Standard     Home Equipment: Environmental consultant - 2 wheels (reports this is in her garage)   Additional Comments: not forthcoming with answers      Prior Functioning/Environment Level of Independence: Independent        Comments: reports several falls, does not use AD at home, reports she was done OP PT. Drives herself to/from appts, gets own groceries. Reports she wears slip on shoes only d/t "too lazy" to put on socks/tie shoes. Reports she does not cook, buys all simple/light/pre-prepped foods.        OT Problem List: Decreased strength;Decreased  activity tolerance;Impaired balance (sitting and/or standing);Decreased coordination;Decreased safety awareness;Decreased knowledge of use of DME or AE;Cardiopulmonary status limiting activity      OT Treatment/Interventions: Self-care/ADL training;DME and/or AE instruction;Therapeutic activities;Balance training;Therapeutic exercise;Patient/family education    OT Goals(Current goals can be found in the care plan section) Acute Rehab OT Goals Patient Stated Goal: to go home today OT Goal Formulation: With patient Time For Goal Achievement: 04/07/20 Potential to Achieve Goals: Fair ADL Goals Pt Will Perform Lower Body Dressing: with set-up;sitting/lateral leans Pt Will Transfer to Toilet: with modified independence;ambulating (with LRAD to commode) Pt Will Perform Toileting - Clothing Manipulation and hygiene: with supervision;sit to/from stand Pt/caregiver will Perform Home Exercise Program: Increased strength;Both right and left upper extremity;With Supervision  OT Frequency: Min 1X/week   Barriers to D/C:            Co-evaluation              AM-PAC OT "6 Clicks" Daily Activity     Outcome Measure Help from another person eating meals?: None Help from another person taking care of personal grooming?: A  Little Help from another person toileting, which includes using toliet, bedpan, or urinal?: A Little Help from another person bathing (including washing, rinsing, drying)?: None Help from another person to put on and taking off regular upper body clothing?: A Little Help from another person to put on and taking off regular lower body clothing?: A Lot 6 Click Score: 19   End of Session Equipment Utilized During Treatment: Gait belt;Rolling walker Nurse Communication: Mobility status  Activity Tolerance: Patient tolerated treatment well Patient left: in bed;with call bell/phone within reach;with bed alarm set;with family/visitor present (medical POA neighbor present with  patient at end of session)  OT Visit Diagnosis: Unsteadiness on feet (R26.81);Muscle weakness (generalized) (M62.81)                Time: 1030-1103 OT Time Calculation (min): 33 min Charges:  OT General Charges $OT Visit: 1 Visit OT Evaluation $OT Eval Moderate Complexity: 1 Mod OT Treatments $Self Care/Home Management : 8-22 mins $Therapeutic Activity: 8-22 mins  Rejeana Brock, MS, OTR/L ascom [phone] 03/24/20, 2:23 PM

## 2020-03-24 NOTE — Evaluation (Addendum)
Speech Language Pathology Evaluation Patient Details Name: Theresa Jensen MRN: [id number] DOB: 02-24-1946 Today's Date: 03/24/2020 Time: 1100-1131 SLP Time Calculation (min) (ACUTE ONLY): 31 min  Problem List:  Patient Active Problem List   Diagnosis Date Noted  . TIA (transient ischemic attack) 03/23/2020   Past Medical History: No past medical history on file. Past Surgical History:  HPI:  Per admitting H&P "Theresa Jensen  is a 74 y.o. female with a known history of type 2 diabetes mellitus, hypertension, peripheral neuropathy and recent unsteady gait, who presented to the emergency room with acute onset of fall this morning.  The patient has been having gait instability for a while.  His neighbor and healthcare power of attorney who was with her in the emergency room.  She had no significant injury secondary to her fall.  She was able to walk after her fall with some assistance.  She then developed slurred speech and mild right facial droop.  She admits to dizziness and vertigo with no tinnitus.  No paresthesias or other focal muscle weakness.  No nausea or vomiting or diarrhea or abdominal pain.  She denied any chest pain or palpitations.  No cough or wheezing or hemoptysis.   Assessment / Plan / Recommendation Clinical Impression  Pt was admitted for CVA/TIA with noted slurred speech. Pt reports no Dysphagia with taking meds but reported she has not eaten breakfast yet. She has received a tray but sent it back because she had no tray table. ST called for a new tray called now that Pt has a tray table in her room. Oral mech exam revealed mild right facial weakness, mild left tongue deviation. Informal speech and language eval revealed mild Dysarthria characterized by slightly slurred speech and decreased respiration for sustained conversation. Speech was 90-100 percent intelligible today during this exam. No apparent cognitive or language deficits as Pt was able to follow multistep commands,  answer complex Y/N questions, repeat words and phrases and name and ID objects. Pt utilized over articulation and slowed rate of speech without cues. Discussed results and findings with Pt and recommendations to consider OP Speech therapy if speech does not return to baseline or if she determines that others have difficulty understanding her in conversations.    SLP Assessment  SLP Recommendation/Assessment: All further Speech Lanaguage Pathology  needs can be addressed in the next venue of care SLP Visit Diagnosis: Dysarthria and anarthria (R47.1)    Follow Up Recommendations  Other (comment) (consider OP tx if Dysarthria persists)    Frequency and Duration  N/A  No ST needed as inpatient      SLP Evaluation Cognition  Overall Cognitive Status: Within Functional Limits for tasks assessed Arousal/Alertness: Awake/alert Orientation Level: Oriented to person;Oriented to place;Oriented to time Attention: Focused Focused Attention: Appears intact Memory: Appears intact Immediate Memory Recall: Sock;Blue;Bed Memory Recall Sock: Without Cue Memory Recall Blue: Without Cue Memory Recall Bed: Without Cue Awareness: Appears intact Problem Solving: Appears intact Safety/Judgment: Appears intact       Comprehension  Auditory Comprehension Overall Auditory Comprehension: Appears within functional limits for tasks assessed Yes/No Questions: Within Functional Limits Commands: Within Functional Limits Conversation: Complex EffectiveTechniques: Slowed speech;Stressing words Visual Recognition/Discrimination Discrimination: Within Function Limits Reading Comprehension Reading Status: Not tested    Expression Expression Primary Mode of Expression: Verbal Verbal Expression Overall Verbal Expression: Impaired Initiation: No impairment Automatic Speech: Name;Social Response Level of Generative/Spontaneous Verbalization: Conversation Repetition: No impairment Naming: No  impairment Pragmatics: No impairment Other Verbal Expression Comments: Mild  Dysarthria Written Expression Written Expression: Not tested   Oral / Motor  Motor Speech Resonance: Within functional limits Articulation: Impaired Level of Impairment: Sentence Intelligibility: Intelligible Word: 75-100% accurate Phrase: 75-100% accurate Sentence: 75-100% accurate Conversation: 75-100% accurate Motor Planning: Not tested Effective Techniques: Slow rate;Over-articulate;Pause   GO           Consider OP ST eval  if Dysarthria persists         [name] 03/24/2020, 11:51 AM

## 2020-03-24 NOTE — Consult Note (Signed)
Reason for Consult: stroke Requesting Physician: Dr. Nelson Chimes   CC: R facial droop and R sided weakness     HPI: Theresa Jensen is an 74 y.o. female  with a known history of type 2 diabetes mellitus, hypertension, peripheral neuropathy and recent unsteady gait, who presented to the emergency room with acute onset of fall this morning.  The patient has been having gait instability for a while.  His neighbor and healthcare power of attorney who was with her in the emergency room.  She had no significant injury secondary to her fall.  Pt was found to have L BG stroke   No past medical history on file.   No family history on file.  Social History:  reports that she has quit smoking. She has never used smokeless tobacco. No history on file for alcohol use and drug use.  No Known Allergies  Medications: I have reviewed the patient's current medications.  ROS: History obtained from the patient  General ROS: negative for - chills, fatigue, fever, night sweats, weight gain or weight loss Psychological ROS: negative for - behavioral disorder, hallucinations, memory difficulties, mood swings or suicidal ideation Ophthalmic ROS: negative for - blurry vision, double vision, eye pain or loss of vision ENT ROS: negative for - epistaxis, nasal discharge, oral lesions, sore throat, tinnitus or vertigo Allergy and Immunology ROS: negative for - hives or itchy/watery eyes Hematological and Lymphatic ROS: negative for - bleeding problems, bruising or swollen lymph nodes Endocrine ROS: negative for - galactorrhea, hair pattern changes, polydipsia/polyuria or temperature intolerance Respiratory ROS: negative for - cough, hemoptysis, shortness of breath or wheezing Cardiovascular ROS: negative for - chest pain, dyspnea on exertion, edema or irregular heartbeat Gastrointestinal ROS: negative for - abdominal pain, diarrhea, hematemesis, nausea/vomiting or stool incontinence Genito-Urinary ROS: negative for -  dysuria, hematuria, incontinence or urinary frequency/urgency Musculoskeletal ROS: negative for - joint swelling or muscular weakness Neurological ROS: as noted in HPI Dermatological ROS: negative for rash and skin lesion changes  Physical Examination: Blood pressure 130/65, pulse 92, temperature 98.4 F (36.9 C), resp. rate 16, height 5\' 3"  (1.6 m), weight 90.7 kg, SpO2 100 %.   Neurological Examination   Mental Status: Alert, oriented, thought content appropriate.  Speech fluent without evidence of aphasia.  Able to follow 3 step commands without difficulty. Cranial Nerves: II: Discs flat bilaterally; Visual fields grossly normal, pupils equal, round, reactive to light and accommodation III,IV, VI: ptosis not present, extra-ocular motions intact bilaterally V,VII:? L facial droop VIII: hearing normal bilaterally IX,X: gag reflex present XI: bilateral shoulder shrug XII: midline tongue extension Motor: Right : Upper extremity   5/5    Left:     Upper extremity   5/5  Lower extremity   4+/5     Lower extremity   5/5 Tone and bulk:normal tone throughout; no atrophy noted Sensory: Pinprick and light touch intact throughout, bilaterally       Laboratory Studies:   Basic Metabolic Panel: Recent Labs  Lab 03/23/20 1908  NA 142  K 4.2  CL 99  CO2 32  GLUCOSE 136*  BUN 19  CREATININE 1.33*  CALCIUM 9.1    Liver Function Tests: Recent Labs  Lab 03/23/20 1908  AST 21  ALT 14  ALKPHOS 75  BILITOT 0.6  PROT 7.2  ALBUMIN 3.4*   No results for input(s): LIPASE, AMYLASE in the last 168 hours. No results for input(s): AMMONIA in the last 168 hours.  CBC: Recent Labs  Lab 03/23/20  1908  WBC 12.0*  NEUTROABS 7.3  HGB 11.6*  HCT 36.0  MCV 93.8  PLT 243    Cardiac Enzymes: No results for input(s): CKTOTAL, CKMB, CKMBINDEX, TROPONINI in the last 168 hours.  BNP: Invalid input(s): POCBNP  CBG: Recent Labs  Lab 03/24/20 0914  GLUCAP 153*     Microbiology: Results for orders placed or performed during the hospital encounter of 03/23/20  Resp Panel by RT-PCR (Flu A&B, Covid) Nasopharyngeal Swab     Status: None   Collection Time: 03/23/20 10:30 PM   Specimen: Nasopharyngeal Swab; Nasopharyngeal(NP) swabs in vial transport medium  Result Value Ref Range Status   SARS Coronavirus 2 by RT PCR NEGATIVE NEGATIVE Final    Comment: (NOTE) SARS-CoV-2 target nucleic acids are NOT DETECTED.  The SARS-CoV-2 RNA is generally detectable in upper respiratory specimens during the acute phase of infection. The lowest concentration of SARS-CoV-2 viral copies this assay can detect is 138 copies/mL. A negative result does not preclude SARS-Cov-2 infection and should not be used as the sole basis for treatment or other patient management decisions. A negative result may occur with  improper specimen collection/handling, submission of specimen other than nasopharyngeal swab, presence of viral mutation(s) within the areas targeted by this assay, and inadequate number of viral copies(<138 copies/mL). A negative result must be combined with clinical observations, patient history, and epidemiological information. The expected result is Negative.  Fact Sheet for Patients:  BloggerCourse.com  Fact Sheet for Healthcare Providers:  SeriousBroker.it  This test is no t yet approved or cleared by the [place] FDA and  has been authorized for detection and/or diagnosis of SARS-CoV-2 by FDA under an Emergency Use Authorization (EUA). This EUA will remain  in effect (meaning this test can be used) for the duration of the COVID-19 declaration under Section 564(b)(1) of the Act, 21 U.S.C.section 360bbb-3(b)(1), unless the authorization is terminated  or revoked sooner.       Influenza A by PCR NEGATIVE NEGATIVE Final   Influenza B by PCR NEGATIVE NEGATIVE Final    Comment: (NOTE) The Xpert  Xpress SARS-CoV-2/FLU/RSV plus assay is intended as an aid in the diagnosis of influenza from Nasopharyngeal swab specimens and should not be used as a sole basis for treatment. Nasal washings and aspirates are unacceptable for Xpert Xpress SARS-CoV-2/FLU/RSV testing.  Fact Sheet for Patients: BloggerCourse.com  Fact Sheet for Healthcare Providers: SeriousBroker.it  This test is not yet approved or cleared by the [place] FDA and has been authorized for detection and/or diagnosis of SARS-CoV-2 by FDA under an Emergency Use Authorization (EUA). This EUA will remain in effect (meaning this test can be used) for the duration of the COVID-19 declaration under Section 564(b)(1) of the Act, 21 U.S.C. section 360bbb-3(b)(1), unless the authorization is terminated or revoked.  Performed at [facility], [address] [place], [place] [address]     Coagulation Studies: Recent Labs    03/23/20 1908  LABPROT 11.9  INR 0.9    Urinalysis: No results for input(s): COLORURINE, LABSPEC, PHURINE, GLUCOSEU, HGBUR, BILIRUBINUR, KETONESUR, PROTEINUR, UROBILINOGEN, NITRITE, LEUKOCYTESUR in the last 168 hours.  Invalid input(s): APPERANCEUR  Lipid Panel:     Component Value Date/Time   CHOL 193 03/24/2020 0335   TRIG 223 (H) 03/24/2020 0335   HDL 47 03/24/2020 0335   CHOLHDL 4.1 03/24/2020 0335   VLDL 45 (H) 03/24/2020 0335   LDLCALC 101 (H) 03/24/2020 0335    HgbA1C:  Lab Results  Component Value Date  HGBA1C 6.4 (H) 03/24/2020    Urine Drug Screen:  No results found for: LABOPIA, COCAINSCRNUR, LABBENZ, AMPHETMU, THCU, LABBARB  Alcohol Level:  Recent Labs  Lab 03/23/20 1908  ETH <10    Other results: EKG: normal EKG, normal sinus rhythm, unchanged from previous tracings.  Imaging: MR ANGIO HEAD WO CONTRAST  Result Date: 03/24/2020 CLINICAL DATA:  Initial evaluation for acute slurred speech, balance  difficulty. EXAM: MRI HEAD WITHOUT CONTRAST MRA HEAD WITHOUT CONTRAST TECHNIQUE: Multiplanar, multiecho pulse sequences of the brain and surrounding structures were obtained without intravenous contrast. Angiographic images of the head were obtained using MRA technique without contrast. COMPARISON:  Prior CT from 03/23/2020 FINDINGS: MRI HEAD FINDINGS Brain: Generalized age-related cerebral atrophy. Mild patchy T2/FLAIR hyperintensity within the periventricular and deep white matter both cerebral hemispheres most consistent with chronic small vessel ischemic disease. Mild patchy involvement of the pons. Superimposed subcentimeter remote lacunar infarct at the posterior left centrum semi ovale with associated chronic hemosiderin staining. 1.8 cm acute ischemic lacunar type infarcts seen involving the left basal ganglia, extending from the left lentiform nucleus towards the left corona radiata (series 7, image 19). No associated hemorrhage or mass effect. No other evidence for acute or subacute ischemia. Gray-white matter differentiation otherwise maintained. No other evidence for acute or chronic intracranial hemorrhage. No mass lesion, midline shift or mass effect. No hydrocephalus or extra-axial fluid collection. No made of an empty sella. Midline structures intact. Vascular: Major intracranial vascular flow voids are maintained. Skull and upper cervical spine: Craniocervical junction normal. Bone marrow signal intensity within normal limits. No scalp soft tissue abnormality. Sinuses/Orbits: Patient status post bilateral ocular lens replacement. Paranasal sinuses are clear. No mastoid effusion. Inner ear structures grossly normal. Other: None. MRA HEAD FINDINGS ANTERIOR CIRCULATION: Examination mildly degraded by motion artifact. Visualized distal cervical segments of the internal carotid arteries are patent with symmetric antegrade flow. Petrous segments widely patent. Scattered atheromatous irregularity within  the carotid siphons without high-grade stenosis. A1 segments patent bilaterally. Normal anterior communicating artery complex. Anterior cerebral arteries patent to their distal aspects without stenosis. No M1 stenosis or occlusion. Normal MCA bifurcations. Possible short-segment moderate to severe proximal right M2 stenosis (series 1045, image 9). MCA branches otherwise well perfused distally and are fairly symmetric in nature. POSTERIOR CIRCULATION: Both vertebral arteries patent to the vertebrobasilar junction without stenosis. Left vertebral artery slightly dominant. Right PICA patent. Left PICA not definitely seen. Basilar patent to its distal aspect without stenosis. Superior cerebral arteries patent bilaterally. Both PCAs supplied via the basilar as well as small bilateral posterior communicating arteries. Both PCAs well perfused to their distal aspects without stenosis. No intracranial aneurysm. IMPRESSION: MRI HEAD IMPRESSION: 1. 1.8 cm acute ischemic nonhemorrhagic lacunar type infarct involving the left basal ganglia. 2. Underlying age-related cerebral atrophy with mild chronic small vessel ischemic disease. MRA HEAD IMPRESSION: 1. Negative intracranial MRA for large vessel occlusion. 2. Mild intracranial atherosclerotic change for age. No proximal high-grade or correctable stenosis identified. Electronically Signed   By: Rise Mu M.D.   On: 03/24/2020 00:29   MR BRAIN WO CONTRAST  Result Date: 03/24/2020 CLINICAL DATA:  Initial evaluation for acute slurred speech, balance difficulty. EXAM: MRI HEAD WITHOUT CONTRAST MRA HEAD WITHOUT CONTRAST TECHNIQUE: Multiplanar, multiecho pulse sequences of the brain and surrounding structures were obtained without intravenous contrast. Angiographic images of the head were obtained using MRA technique without contrast. COMPARISON:  Prior CT from 03/23/2020 FINDINGS: MRI HEAD FINDINGS Brain: Generalized age-related cerebral atrophy. Mild patchy  T2/FLAIR  hyperintensity within the periventricular and deep white matter both cerebral hemispheres most consistent with chronic small vessel ischemic disease. Mild patchy involvement of the pons. Superimposed subcentimeter remote lacunar infarct at the posterior left centrum semi ovale with associated chronic hemosiderin staining. 1.8 cm acute ischemic lacunar type infarcts seen involving the left basal ganglia, extending from the left lentiform nucleus towards the left corona radiata (series 7, image 19). No associated hemorrhage or mass effect. No other evidence for acute or subacute ischemia. Gray-white matter differentiation otherwise maintained. No other evidence for acute or chronic intracranial hemorrhage. No mass lesion, midline shift or mass effect. No hydrocephalus or extra-axial fluid collection. No made of an empty sella. Midline structures intact. Vascular: Major intracranial vascular flow voids are maintained. Skull and upper cervical spine: Craniocervical junction normal. Bone marrow signal intensity within normal limits. No scalp soft tissue abnormality. Sinuses/Orbits: Patient status post bilateral ocular lens replacement. Paranasal sinuses are clear. No mastoid effusion. Inner ear structures grossly normal. Other: None. MRA HEAD FINDINGS ANTERIOR CIRCULATION: Examination mildly degraded by motion artifact. Visualized distal cervical segments of the internal carotid arteries are patent with symmetric antegrade flow. Petrous segments widely patent. Scattered atheromatous irregularity within the carotid siphons without high-grade stenosis. A1 segments patent bilaterally. Normal anterior communicating artery complex. Anterior cerebral arteries patent to their distal aspects without stenosis. No M1 stenosis or occlusion. Normal MCA bifurcations. Possible short-segment moderate to severe proximal right M2 stenosis (series 1045, image 9). MCA branches otherwise well perfused distally and are fairly symmetric in  nature. POSTERIOR CIRCULATION: Both vertebral arteries patent to the vertebrobasilar junction without stenosis. Left vertebral artery slightly dominant. Right PICA patent. Left PICA not definitely seen. Basilar patent to its distal aspect without stenosis. Superior cerebral arteries patent bilaterally. Both PCAs supplied via the basilar as well as small bilateral posterior communicating arteries. Both PCAs well perfused to their distal aspects without stenosis. No intracranial aneurysm. IMPRESSION: MRI HEAD IMPRESSION: 1. 1.8 cm acute ischemic nonhemorrhagic lacunar type infarct involving the left basal ganglia. 2. Underlying age-related cerebral atrophy with mild chronic small vessel ischemic disease. MRA HEAD IMPRESSION: 1. Negative intracranial MRA for large vessel occlusion. 2. Mild intracranial atherosclerotic change for age. No proximal high-grade or correctable stenosis identified. Electronically Signed   By: Rise MuBenjamin  McClintock M.D.   On: 03/24/2020 00:29   CT HEAD CODE STROKE WO CONTRAST  Result Date: 03/23/2020 CLINICAL DATA:  Code stroke.  Slurred speech EXAM: CT HEAD WITHOUT CONTRAST TECHNIQUE: Contiguous axial images were obtained from the base of the skull through the vertex without intravenous contrast. COMPARISON:  None. FINDINGS: Brain: There is no acute intracranial hemorrhage, mass effect, or edema. Gray-white differentiation is preserved. Age-indeterminate small vessel infarct of the left centrum semiovale. Additional patchy hypoattenuation in the supratentorial white matter is nonspecific but may reflect minor chronic microvascular ischemic changes. Ventricles and sulci are within normal limits in size and configuration. There is no extra-axial collection. Vascular: No hyperdense vessel. There is intracranial atherosclerotic calcification at the skull base. Skull: Unremarkable. Sinuses/Orbits: Aerated.  No significant abnormality of the orbits. Other: Mastoid air cells are clear. ASPECTS  ([place] Stroke Program Early CT Score) - Ganglionic level infarction (caudate, lentiform nuclei, internal capsule, insula, M1-M3 cortex): 7 - Supraganglionic infarction (M4-M6 cortex): 3 Total score (0-10 with 10 being normal): 10 IMPRESSION: There is no acute intracranial hemorrhage or evidence of acute infarction. ASPECT score is 10. Age-indeterminate small vessel infarct of the left centrum semiovale. These results were called by  telephone at the time of interpretation on 03/23/2020 at 6:58 pm to provider [facility] , who verbally acknowledged these results. Electronically Signed   By: Guadlupe Spanish M.D.   On: 03/23/2020 19:01     Assessment/Plan:  74 y.o. female  with a known history of type 2 diabetes mellitus, hypertension, peripheral neuropathy and recent unsteady gait, who presented to the emergency room with acute onset of fall this morning.  The patient has been having gait instability for a while.  His neighbor and healthcare power of attorney who was with her in the emergency room.  She had no significant injury secondary to her fall.  Pt was found to have L BG stroke  - On ASA 81mg  daily please increase to 325 daily - Pt lives at home but lives alone so suspect will need rehab or some assistance - appreciate assistance from physical therapy - d/c planning based on placement  03/24/2020, 11:25 AM

## 2020-03-24 NOTE — Discharge Summary (Signed)
Physician Discharge Summary  Theresa RosalesDeborah Gunner WUJ:[id number] DOB: Sep 28, 1945 DOA: 03/23/2020  PCP: Thomes DinningWeeks, Cynthia, MD  Admit date: 03/23/2020 Discharge date: 03/24/2020  Admitted From: Home Disposition:  Home  Recommendations for Outpatient Follow-up:  1. Follow up with PCP in 1-2 weeks 2. Please obtain BMP/CBC in one week 3. Please follow up on the following pending results:None  Home Health: No Equipment/Devices: Rolling walker Discharge Condition: Stable CODE STATUS: Full Diet recommendation: Heart Healthy / Carb Modified   Brief/Interim Summary: Theresa RosalesDeborah Quadros  is a 74 y.o. female with a known history of type 2 diabetes mellitus, hypertension, peripheral neuropathy and recent unsteady gait, who presented to the emergency room with acute onset of fall this morning.  The patient has been having gait instability for a while.  His neighbor and healthcare power of attorney who was with her in the emergency room.  She had no significant injury secondary to her fall.  She was able to walk after her fall with some assistance.  She then developed slurred speech and mild right facial droop.  She admits to dizziness and vertigo with no tinnitus.  No paresthesias or other focal muscle weakness.  No nausea or vomiting or diarrhea or abdominal pain.  She denied any chest pain or palpitations.  No cough or wheezing or hemoptysis.  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  MRA was negative for large vessel occlusion and it showed mild intracranial atherosclerotic change with no proximal high-grade or correctable stenosis identified.  The patient [facility] that showed: Right Carotid: Plaque noted in the ECA. The extracranial vessels were         near-normal with only minimal wall thickening or plaque.  Left  Carotid: Non-hemodynamically significant plaque noted in the CCA. The        extracranial vessels were near-normal with only minimal wall        thickening or plaque.  Vertebrals: Both vertebral arteries were patent with antegrade flow.  Subclavians: Normal flow hemodynamics were seen in bilateral subclavian        arteries.  Patient was on aspirin and pravastatin at home.  Pravastatin was discontinued and she was started on Lipitor.  She will continue with aspirin and Plavix for 3 weeks and then only with Plavix.  She completed the stroke work-up.  Echocardiogram without any significant abnormality.  Our physical therapist evaluated her and they were recommending outpatient therapy.  Patient was also offered home health services which she declined. She continues to have mild dysarthria and can get benefit with outpatient speech therapy if she continue with these symptoms.  No other neurologic deficit.  She was also evaluated by neurologist while in the hospital and they are recommending outpatient neurology follow-up.  Patient has well-controlled diabetes with A1c of 6.4.  She will continue with her current home regimen and follow-up with her primary care provider.  Discharge Diagnoses:  Active Problems:   TIA (transient ischemic attack)   Discharge Instructions  Discharge Instructions    AMB referral to rehabilitation   Complete by: As directed    Patient with left basal ganglia stroke.   Diet - low sodium heart healthy   Complete by: As directed    Discharge instructions   Complete by: As directed    It was pleasure taking care of you. We change your pravastatin with atorvastatin  as you need high intensity. Please continue aspirin and Plavix together for 3 weeks and then only Plavix and discontinue aspirin. Please continue with outpatient physical therapy. Please follow-up with neurology as an outpatient.   Increase activity slowly   Complete by: As  directed      Allergies as of 03/24/2020   No Known Allergies     Medication List    STOP taking these medications   pravastatin 40 MG tablet Commonly known as: PRAVACHOL     TAKE these medications   aspirin 81 MG EC tablet Take 81 mg by mouth daily.   atorvastatin 40 MG tablet Commonly known as: LIPITOR Take 1 tablet (40 mg total) by mouth daily.   calcium citrate-vitamin D 315-200 MG-UNIT tablet Commonly known as: CITRACAL+D Take 1 tablet by mouth daily.   clopidogrel 75 MG tablet Commonly known as: PLAVIX Take 1 tablet (75 mg total) by mouth daily. Start taking on: March 25, 2020   DULoxetine 60 MG capsule Commonly known as: CYMBALTA Take 60 mg by mouth daily.   gabapentin 300 MG capsule Commonly known as: NEURONTIN Take 900 mg by mouth 2 (two) times daily.   Multivitamin Adult Tabs Take 1 tablet by mouth daily.   Premarin 0.45 MG tablet Generic drug: estrogens (conjugated) Take 0.45 mg by mouth daily.   traZODone 50 MG tablet Commonly known as: DESYREL Take 100 mg by mouth at bedtime.   Victoza 18 MG/3ML Sopn Generic drug: liraglutide Inject 1.2 mg into the skin daily.       Follow-up Information    Thomes Dinning, MD. Schedule an appointment as soon as possible for a visit.   Specialty: Family Medicine Contact information: [address] [place] [address] [phone]              No Known Allergies  Consultations:  Neurology  Procedures/Studies: MR ANGIO HEAD WO CONTRAST  Result Date: 03/24/2020 CLINICAL DATA:  Initial evaluation for acute slurred speech, balance difficulty. EXAM: MRI HEAD WITHOUT CONTRAST MRA HEAD WITHOUT CONTRAST TECHNIQUE: Multiplanar, multiecho pulse sequences of the brain and surrounding structures were obtained without intravenous contrast. Angiographic images of the head were obtained using MRA technique without contrast. COMPARISON:  Prior CT from 03/23/2020 FINDINGS: MRI HEAD FINDINGS  Brain: Generalized age-related cerebral atrophy. Mild patchy T2/FLAIR hyperintensity within the periventricular and deep white matter both cerebral hemispheres most consistent with chronic small vessel ischemic disease. Mild patchy involvement of the pons. Superimposed subcentimeter remote lacunar infarct at the posterior left centrum semi ovale with associated chronic hemosiderin staining. 1.8 cm acute ischemic lacunar type infarcts seen involving the left basal ganglia, extending from the left lentiform nucleus towards the left corona radiata (series 7, image 19). No associated hemorrhage or mass effect. No other evidence for acute or subacute ischemia. Gray-white matter differentiation otherwise maintained. No other evidence for acute or chronic intracranial hemorrhage. No mass lesion, midline shift or mass effect. No hydrocephalus or extra-axial fluid collection. No made of an empty sella. Midline structures intact. Vascular: Major intracranial vascular flow voids are maintained. Skull and upper cervical spine: Craniocervical junction normal. Bone marrow signal intensity within normal limits. No scalp soft tissue abnormality. Sinuses/Orbits: Patient status post bilateral ocular lens replacement. Paranasal sinuses are clear. No mastoid effusion. Inner ear structures grossly normal. Other: None. MRA HEAD FINDINGS ANTERIOR CIRCULATION: Examination mildly degraded by motion artifact. Visualized distal cervical segments of the internal carotid arteries are patent with symmetric antegrade flow. Petrous segments widely patent. Scattered atheromatous  irregularity within the carotid siphons without high-grade stenosis. A1 segments patent bilaterally. Normal anterior communicating artery complex. Anterior cerebral arteries patent to their distal aspects without stenosis. No M1 stenosis or occlusion. Normal MCA bifurcations. Possible short-segment moderate to severe proximal right M2 stenosis (series 1045, image 9). MCA  branches otherwise well perfused distally and are fairly symmetric in nature. POSTERIOR CIRCULATION: Both vertebral arteries patent to the vertebrobasilar junction without stenosis. Left vertebral artery slightly dominant. Right PICA patent. Left PICA not definitely seen. Basilar patent to its distal aspect without stenosis. Superior cerebral arteries patent bilaterally. Both PCAs supplied via the basilar as well as small bilateral posterior communicating arteries. Both PCAs well perfused to their distal aspects without stenosis. No intracranial aneurysm. IMPRESSION: MRI HEAD IMPRESSION: 1. 1.8 cm acute ischemic nonhemorrhagic lacunar type infarct involving the left basal ganglia. 2. Underlying age-related cerebral atrophy with mild chronic small vessel ischemic disease. MRA HEAD IMPRESSION: 1. Negative intracranial MRA for large vessel occlusion. 2. Mild intracranial atherosclerotic change for age. No proximal high-grade or correctable stenosis identified. Electronically Signed   By: Rise Mu M.D.   On: 03/24/2020 00:29   MR BRAIN WO CONTRAST  Result Date: 03/24/2020 CLINICAL DATA:  Initial evaluation for acute slurred speech, balance difficulty. EXAM: MRI HEAD WITHOUT CONTRAST MRA HEAD WITHOUT CONTRAST TECHNIQUE: Multiplanar, multiecho pulse sequences of the brain and surrounding structures were obtained without intravenous contrast. Angiographic images of the head were obtained using MRA technique without contrast. COMPARISON:  Prior CT from 03/23/2020 FINDINGS: MRI HEAD FINDINGS Brain: Generalized age-related cerebral atrophy. Mild patchy T2/FLAIR hyperintensity within the periventricular and deep white matter both cerebral hemispheres most consistent with chronic small vessel ischemic disease. Mild patchy involvement of the pons. Superimposed subcentimeter remote lacunar infarct at the posterior left centrum semi ovale with associated chronic hemosiderin staining. 1.8 cm acute ischemic lacunar  type infarcts seen involving the left basal ganglia, extending from the left lentiform nucleus towards the left corona radiata (series 7, image 19). No associated hemorrhage or mass effect. No other evidence for acute or subacute ischemia. Gray-white matter differentiation otherwise maintained. No other evidence for acute or chronic intracranial hemorrhage. No mass lesion, midline shift or mass effect. No hydrocephalus or extra-axial fluid collection. No made of an empty sella. Midline structures intact. Vascular: Major intracranial vascular flow voids are maintained. Skull and upper cervical spine: Craniocervical junction normal. Bone marrow signal intensity within normal limits. No scalp soft tissue abnormality. Sinuses/Orbits: Patient status post bilateral ocular lens replacement. Paranasal sinuses are clear. No mastoid effusion. Inner ear structures grossly normal. Other: None. MRA HEAD FINDINGS ANTERIOR CIRCULATION: Examination mildly degraded by motion artifact. Visualized distal cervical segments of the internal carotid arteries are patent with symmetric antegrade flow. Petrous segments widely patent. Scattered atheromatous irregularity within the carotid siphons without high-grade stenosis. A1 segments patent bilaterally. Normal anterior communicating artery complex. Anterior cerebral arteries patent to their distal aspects without stenosis. No M1 stenosis or occlusion. Normal MCA bifurcations. Possible short-segment moderate to severe proximal right M2 stenosis (series 1045, image 9). MCA branches otherwise well perfused distally and are fairly symmetric in nature. POSTERIOR CIRCULATION: Both vertebral arteries patent to the vertebrobasilar junction without stenosis. Left vertebral artery slightly dominant. Right PICA patent. Left PICA not definitely seen. Basilar patent to its distal aspect without stenosis. Superior cerebral arteries patent bilaterally. Both PCAs supplied via the basilar as well as small  bilateral posterior communicating arteries. Both PCAs well perfused to their distal aspects without stenosis. No intracranial  aneurysm. IMPRESSION: MRI HEAD IMPRESSION: 1. 1.8 cm acute ischemic nonhemorrhagic lacunar type infarct involving the left basal ganglia. 2. Underlying age-related cerebral atrophy with mild chronic small vessel ischemic disease. MRA HEAD IMPRESSION: 1. Negative intracranial MRA for large vessel occlusion. 2. Mild intracranial atherosclerotic change for age. No proximal high-grade or correctable stenosis identified. Electronically Signed   By: Rise Mu M.D.   On: 03/24/2020 00:29   ECHOCARDIOGRAM COMPLETE  Result Date: 03/24/2020    ECHOCARDIOGRAM REPORT   Patient Name:   CEANNA WAREING Date of Exam: 03/24/2020 Medical Rec #:  [id number]      Height:       63.0 in Accession #:    [id number]     Weight:       200.0 lb Date of Birth:  1945-06-27     BSA:          1.934 m Patient Age:    74 years       BP:           134/78 mmHg Patient Gender: F              HR:           99 bpm. Exam Location:  [facility] Procedure: 2D Echo, Color Doppler and Cardiac Doppler Indications:     TIA 435.9  History:         Patient has no prior history of Echocardiogram examinations. No                  medical history on file.  Sonographer:     Cristela Blue RDCS (AE) Referring Phys:  [id number] [facility] Wilmer Santillo Diagnosing Phys: Adrian Blackwater MD IMPRESSIONS  1. Left ventricular ejection fraction, by estimation, is 60 to 65%. The left ventricle has normal function. The left ventricle has no regional wall motion abnormalities. Left ventricular diastolic parameters are consistent with Grade I diastolic dysfunction (impaired relaxation).  2. Right ventricular systolic function is normal. The right ventricular size is normal.  3. Left atrial size was mildly dilated.  4. Right atrial size was mildly dilated.  5. The mitral valve is normal in structure. Mild mitral valve regurgitation. No evidence of mitral stenosis.  6.  The aortic valve is normal in structure. Aortic valve regurgitation is not visualized. No aortic stenosis is present.  7. The inferior vena cava is normal in size with greater than 50% respiratory variability, suggesting right atrial pressure of 3 mmHg. Conclusion(s)/Recommendation(s): No intracardiac source of embolism detected on this transthoracic study. A transesophageal echocardiogram is recommended to exclude cardiac source of embolism if clinically indicated. FINDINGS  Left Ventricle: Left ventricular ejection fraction, by estimation, is 60 to 65%. The left ventricle has normal function. The left ventricle has no regional wall motion abnormalities. The left ventricular internal cavity size was normal in size. There is  no left ventricular hypertrophy. Left ventricular diastolic parameters are consistent with Grade I diastolic dysfunction (impaired relaxation). Right Ventricle: The right ventricular size is normal. No increase in right ventricular wall thickness. Right ventricular systolic function is normal. Left Atrium: Left atrial size was mildly dilated. Right Atrium: Right atrial size was mildly dilated. Pericardium: There is no evidence of pericardial effusion. Mitral Valve: The mitral valve is normal in structure. Mild mitral valve regurgitation. No evidence of mitral valve stenosis. Tricuspid Valve: The tricuspid valve is normal in structure. Tricuspid valve regurgitation is mild . No evidence of tricuspid stenosis. Aortic Valve: The aortic valve is normal in structure. Aortic  valve regurgitation is not visualized. No aortic stenosis is present. Aortic valve mean gradient measures 3.0 mmHg. Aortic valve peak gradient measures 5.1 mmHg. Aortic valve area, by VTI measures 2.44 cm. Pulmonic Valve: The pulmonic valve was normal in structure. Pulmonic valve regurgitation is not visualized. No evidence of pulmonic stenosis. Aorta: The aortic root is normal in size and structure. Venous: The inferior vena  cava is normal in size with greater than 50% respiratory variability, suggesting right atrial pressure of 3 mmHg. IAS/Shunts: No atrial level shunt detected by color flow Doppler.  LEFT VENTRICLE PLAX 2D LVIDd:         3.86 cm  Diastology LVIDs:         2.71 cm  LV e' medial:    5.22 cm/s LV PW:         1.21 cm  LV E/e' medial:  17.3 LV IVS:        0.99 cm  LV e' lateral:   6.64 cm/s LVOT diam:     2.00 cm  LV E/e' lateral: 13.6 LV SV:         51 LV SV Index:   26 LVOT Area:     3.14 cm  RIGHT VENTRICLE RV Basal diam:  2.60 cm RV S prime:     17.40 cm/s TAPSE (M-mode): 4.0 cm LEFT ATRIUM             Index       RIGHT ATRIUM           Index LA diam:        3.40 cm 1.76 cm/m  RA Area:     15.20 cm LA Vol (A2C):   82.1 ml 42.46 ml/m RA Volume:   38.00 ml  19.65 ml/m LA Vol (A4C):   68.0 ml 35.17 ml/m LA Biplane Vol: 74.9 ml 38.74 ml/m  AORTIC VALVE                   PULMONIC VALVE AV Area (Vmax):    1.94 cm    PV Vmax:        0.93 m/s AV Area (Vmean):   2.03 cm    PV Peak grad:   3.5 mmHg AV Area (VTI):     2.44 cm    RVOT Peak grad: 5 mmHg AV Vmax:           112.50 cm/s AV Vmean:          79.700 cm/s AV VTI:            0.208 m AV Peak Grad:      5.1 mmHg AV Mean Grad:      3.0 mmHg LVOT Vmax:         69.50 cm/s LVOT Vmean:        51.500 cm/s LVOT VTI:          0.161 m LVOT/AV VTI ratio: 0.78  AORTA Ao Root diam: 3.20 cm MITRAL VALVE                TRICUSPID VALVE MV Area (PHT): 3.37 cm     TR Peak grad:   11.2 mmHg MV Decel Time: 225 msec     TR Vmax:        167.00 cm/s MV E velocity: 90.20 cm/s MV A velocity: 141.00 cm/s  SHUNTS MV E/A ratio:  0.64         Systemic VTI:  0.16 m  Systemic Diam: 2.00 cm Adrian Blackwater MD Electronically signed by Adrian Blackwater MD Signature Date/Time: 03/24/2020/11:42:53 AM    Final    CT HEAD CODE STROKE WO CONTRAST  Result Date: 03/23/2020 CLINICAL DATA:  Code stroke.  Slurred speech EXAM: CT HEAD WITHOUT CONTRAST TECHNIQUE: Contiguous axial  images were obtained from the base of the skull through the vertex without intravenous contrast. COMPARISON:  None. FINDINGS: Brain: There is no acute intracranial hemorrhage, mass effect, or edema. Gray-white differentiation is preserved. Age-indeterminate small vessel infarct of the left centrum semiovale. Additional patchy hypoattenuation in the supratentorial white matter is nonspecific but may reflect minor chronic microvascular ischemic changes. Ventricles and sulci are within normal limits in size and configuration. There is no extra-axial collection. Vascular: No hyperdense vessel. There is intracranial atherosclerotic calcification at the skull base. Skull: Unremarkable. Sinuses/Orbits: Aerated.  No significant abnormality of the orbits. Other: Mastoid air cells are clear. ASPECTS (Alberta Stroke Program Early CT Score) - Ganglionic level infarction (caudate, lentiform nuclei, internal capsule, insula, M1-M3 cortex): 7 - Supraganglionic infarction (M4-M6 cortex): 3 Total score (0-10 with 10 being normal): 10 IMPRESSION: There is no acute intracranial hemorrhage or evidence of acute infarction. ASPECT score is 10. Age-indeterminate small vessel infarct of the left centrum semiovale. These results were called by telephone at the time of interpretation on 03/23/2020 at 6:58 pm to provider [facility] , who verbally acknowledged these results. Electronically Signed   By: Guadlupe Spanish M.D.   On: 03/23/2020 19:01     Subjective: Patient was seen and examined during morning rounds.  He continues to feel some generalized weakness.  Denies any dizziness.  She wants to go home and does not want any home health services.  Discharge Exam: Vitals:   03/24/20 0919 03/24/20 1230  BP: 130/65 129/62  Pulse: 92 97  Resp: 16   Temp: 98.4 F (36.9 C) 98.9 F (37.2 C)  SpO2: 100% 96%   Vitals:   03/24/20 0355 03/24/20 0611 03/24/20 0919 03/24/20 1230  BP: (!) 148/78 134/78 130/65 129/62  Pulse: (!)  102 99 92 97  Resp:   16   Temp:  98.4 F (36.9 C) 98.4 F (36.9 C) 98.9 F (37.2 C)  TempSrc:  Oral  Oral  SpO2: 99% 99% 100% 96%  Weight:      Height:        General: Pt is alert, awake, not in acute distress Cardiovascular: RRR, S1/S2 +, no rubs, no gallops Respiratory: CTA bilaterally, no wheezing, no rhonchi Abdominal: Soft, NT, ND, bowel sounds + Extremities: no edema, no cyanosis   The results of significant diagnostics from this hospitalization (including imaging, microbiology, ancillary and laboratory) are listed below for reference.    Microbiology: Recent Results (from the past 240 hour(s))  Resp Panel by RT-PCR (Flu A&B, Covid) Nasopharyngeal Swab     Status: None   Collection Time: 03/23/20 10:30 PM   Specimen: Nasopharyngeal Swab; Nasopharyngeal(NP) swabs in vial transport medium  Result Value Ref Range Status   SARS Coronavirus 2 by RT PCR NEGATIVE NEGATIVE Final    Comment: (NOTE) SARS-CoV-2 target nucleic acids are NOT DETECTED.  The SARS-CoV-2 RNA is generally detectable in upper respiratory specimens during the acute phase of infection. The lowest concentration of SARS-CoV-2 viral copies this assay can detect is 138 copies/mL. A negative result does not preclude SARS-Cov-2 infection and should not be used as the sole basis for treatment or other patient management decisions. A negative result may occur with  improper specimen collection/handling, submission of specimen other than nasopharyngeal swab, presence of viral mutation(s) within the areas targeted by this assay, and inadequate number of viral copies(<138 copies/mL). A negative result must be combined with clinical observations, patient history, and epidemiological information. The expected result is Negative.  Fact Sheet for Patients:  BloggerCourse.com  Fact Sheet for Healthcare Providers:  SeriousBroker.it  This test is no t yet approved or  cleared by the [place] FDA and  has been authorized for detection and/or diagnosis of SARS-CoV-2 by FDA under an Emergency Use Authorization (EUA). This EUA will remain  in effect (meaning this test can be used) for the duration of the COVID-19 declaration under Section 564(b)(1) of the Act, 21 U.S.C.section 360bbb-3(b)(1), unless the authorization is terminated  or revoked sooner.       Influenza A by PCR NEGATIVE NEGATIVE Final   Influenza B by PCR NEGATIVE NEGATIVE Final    Comment: (NOTE) The Xpert Xpress SARS-CoV-2/FLU/RSV plus assay is intended as an aid in the diagnosis of influenza from Nasopharyngeal swab specimens and should not be used as a sole basis for treatment. Nasal washings and aspirates are unacceptable for Xpert Xpress SARS-CoV-2/FLU/RSV testing.  Fact Sheet for Patients: BloggerCourse.com  Fact Sheet for Healthcare Providers: SeriousBroker.it  This test is not yet approved or cleared by the [place] FDA and has been authorized for detection and/or diagnosis of SARS-CoV-2 by FDA under an Emergency Use Authorization (EUA). This EUA will remain in effect (meaning this test can be used) for the duration of the COVID-19 declaration under Section 564(b)(1) of the Act, 21 U.S.C. section 360bbb-3(b)(1), unless the authorization is terminated or revoked.  Performed at [facility], [address] [place], [place] [address]      Labs: BNP (last 3 results) No results for input(s): BNP in the last 8760 hours. Basic Metabolic Panel: Recent Labs  Lab 03/23/20 1908  NA 142  K 4.2  CL 99  CO2 32  GLUCOSE 136*  BUN 19  CREATININE 1.33*  CALCIUM 9.1   Liver Function Tests: Recent Labs  Lab 03/23/20 1908  AST 21  ALT 14  ALKPHOS 75  BILITOT 0.6  PROT 7.2  ALBUMIN 3.4*   No results for input(s): LIPASE, AMYLASE in the last 168 hours. No results for input(s): AMMONIA in the last  168 hours. CBC: Recent Labs  Lab 03/23/20 1908  WBC 12.0*  NEUTROABS 7.3  HGB 11.6*  HCT 36.0  MCV 93.8  PLT 243   Cardiac Enzymes: No results for input(s): CKTOTAL, CKMB, CKMBINDEX, TROPONINI in the last 168 hours. BNP: Invalid input(s): POCBNP CBG: Recent Labs  Lab 03/24/20 0914 03/24/20 1227  GLUCAP 153* 169*   D-Dimer No results for input(s): DDIMER in the last 72 hours. Hgb A1c Recent Labs    03/24/20 0335  HGBA1C 6.4*   Lipid Profile Recent Labs    03/24/20 0335  CHOL 193  HDL 47  LDLCALC 101*  TRIG 223*  CHOLHDL 4.1   Thyroid function studies No results for input(s): TSH, T4TOTAL, T3FREE, THYROIDAB in the last 72 hours.  Invalid input(s): FREET3 Anemia work up No results for input(s): VITAMINB12, FOLATE, FERRITIN, TIBC, IRON, RETICCTPCT in the last 72 hours. Urinalysis No results found for: COLORURINE, APPEARANCEUR, LABSPEC, PHURINE, GLUCOSEU, HGBUR, BILIRUBINUR, KETONESUR, PROTEINUR, UROBILINOGEN, NITRITE, LEUKOCYTESUR Sepsis Labs Invalid input(s): PROCALCITONIN,  WBC,  LACTICIDVEN Microbiology Recent Results (from the past 240 hour(s))  Resp Panel by RT-PCR (Flu A&B, Covid) Nasopharyngeal Swab  Status: None   Collection Time: 03/23/20 10:30 PM   Specimen: Nasopharyngeal Swab; Nasopharyngeal(NP) swabs in vial transport medium  Result Value Ref Range Status   SARS Coronavirus 2 by RT PCR NEGATIVE NEGATIVE Final    Comment: (NOTE) SARS-CoV-2 target nucleic acids are NOT DETECTED.  The SARS-CoV-2 RNA is generally detectable in upper respiratory specimens during the acute phase of infection. The lowest concentration of SARS-CoV-2 viral copies this assay can detect is 138 copies/mL. A negative result does not preclude SARS-Cov-2 infection and should not be used as the sole basis for treatment or other patient management decisions. A negative result may occur with  improper specimen collection/handling, submission of specimen other than  nasopharyngeal swab, presence of viral mutation(s) within the areas targeted by this assay, and inadequate number of viral copies(<138 copies/mL). A negative result must be combined with clinical observations, patient history, and epidemiological information. The expected result is Negative.  Fact Sheet for Patients:  BloggerCourse.com  Fact Sheet for Healthcare Providers:  SeriousBroker.it  This test is no t yet approved or cleared by the [place] FDA and  has been authorized for detection and/or diagnosis of SARS-CoV-2 by FDA under an Emergency Use Authorization (EUA). This EUA will remain  in effect (meaning this test can be used) for the duration of the COVID-19 declaration under Section 564(b)(1) of the Act, 21 U.S.C.section 360bbb-3(b)(1), unless the authorization is terminated  or revoked sooner.       Influenza A by PCR NEGATIVE NEGATIVE Final   Influenza B by PCR NEGATIVE NEGATIVE Final    Comment: (NOTE) The Xpert Xpress SARS-CoV-2/FLU/RSV plus assay is intended as an aid in the diagnosis of influenza from Nasopharyngeal swab specimens and should not be used as a sole basis for treatment. Nasal washings and aspirates are unacceptable for Xpert Xpress SARS-CoV-2/FLU/RSV testing.  Fact Sheet for Patients: BloggerCourse.com  Fact Sheet for Healthcare Providers: SeriousBroker.it  This test is not yet approved or cleared by the [place] FDA and has been authorized for detection and/or diagnosis of SARS-CoV-2 by FDA under an Emergency Use Authorization (EUA). This EUA will remain in effect (meaning this test can be used) for the duration of the COVID-19 declaration under Section 564(b)(1) of the Act, 21 U.S.C. section 360bbb-3(b)(1), unless the authorization is terminated or revoked.  Performed at [facility], [address] [place], [place]  [address]     Time coordinating discharge: Over 30 minutes  SIGNED:  Arnetha Courser, MD  [facility] 03/24/2020, 1:55 PM  If 7PM-7AM, please contact night-coverage [email or web address]  This record has been created using Conservation officer, historic buildings. Errors have been sought and corrected,but may not always be located. Such creation errors do not reflect on the standard of care.

## 2020-03-24 NOTE — Progress Notes (Signed)
Pt refused breakfast tray.  

## 2020-04-07 DIAGNOSIS — R4781 Slurred speech: Secondary | ICD-10-CM

## 2021-10-31 IMAGING — MR MR MRA HEAD W/O CM
1 series · 18 of 48 positions shown · non-contrast
Comparison: Prior CT from 03/23/2020

CLINICAL DATA: Initial evaluation for acute slurred speech, balance
difficulty.

EXAM:
MRI HEAD WITHOUT CONTRAST
MRA HEAD WITHOUT CONTRAST
TECHNIQUE: Multiplanar, multiecho pulse sequences of the brain and surrounding
structures were obtained without intravenous contrast. Angiographic
images of the head were obtained using MRA technique without
contrast.

[Series 15: TOF · axial · 0.5mm · 0.41mm/px · z∈[-31,+64]mm · 18 of 205 slices shown]
[im 1/205]
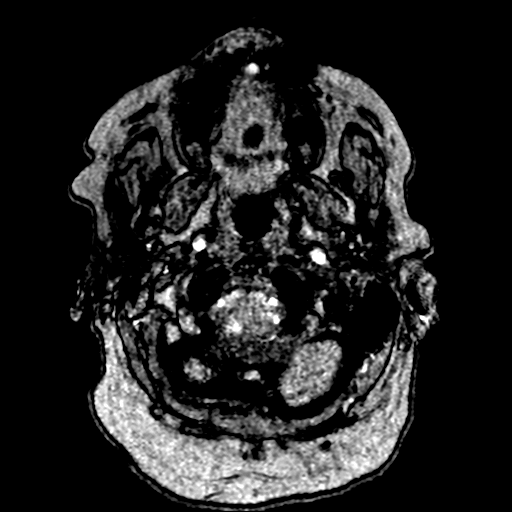
[im 5/205]
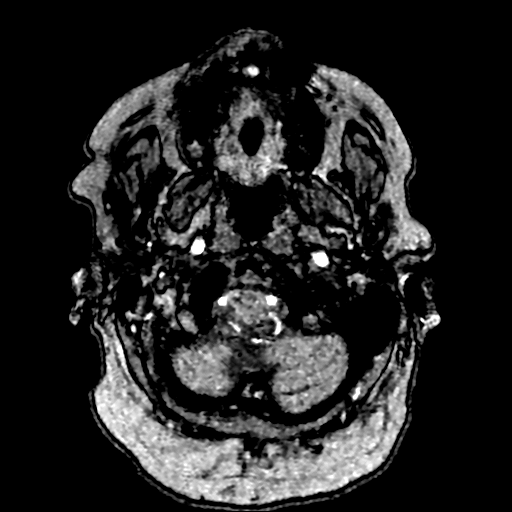
[im 9/205]
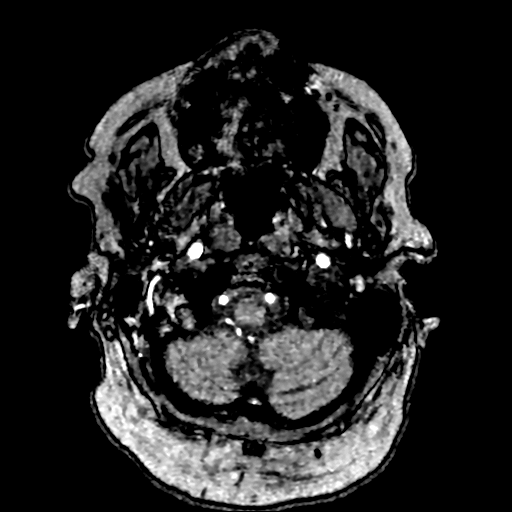
[im 14/205]
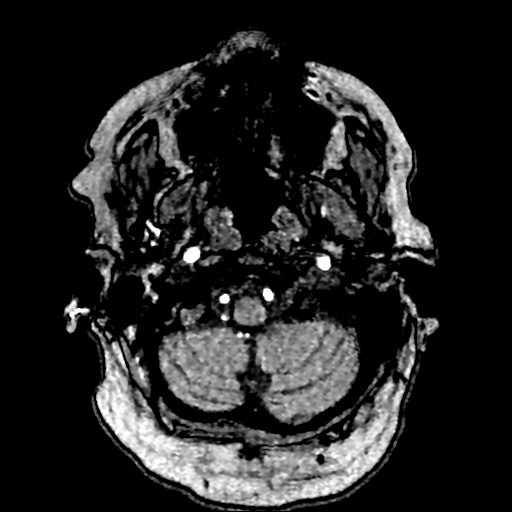
[im 18/205]
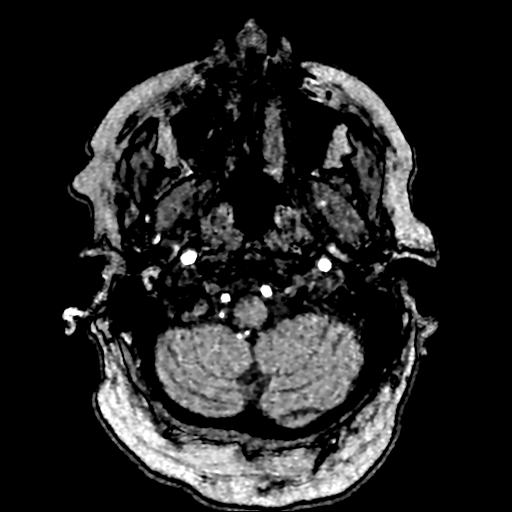
[im 22/205]
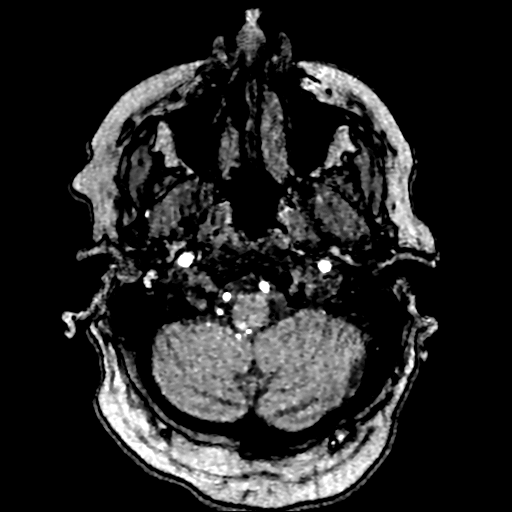
[im 27/205]
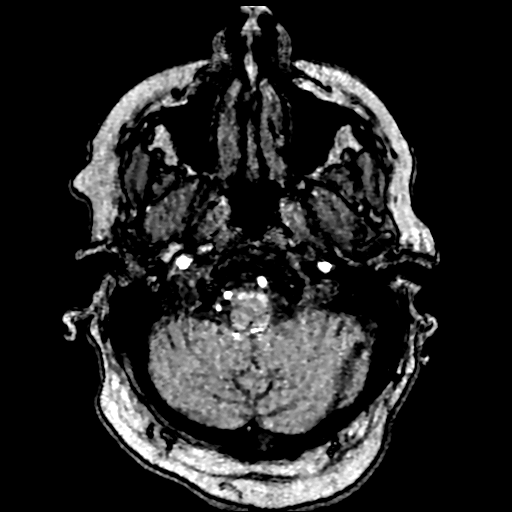
[im 31/205]
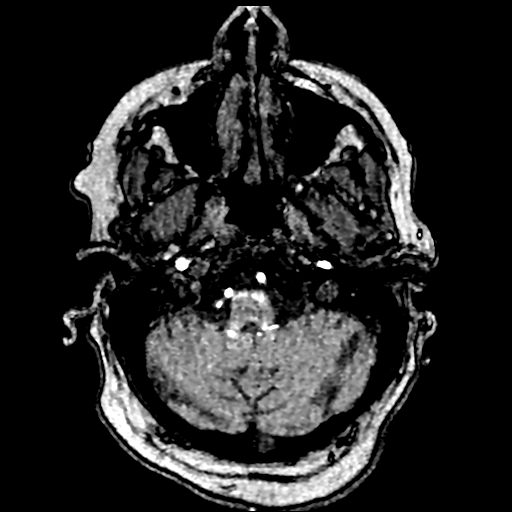
[im 35/205]
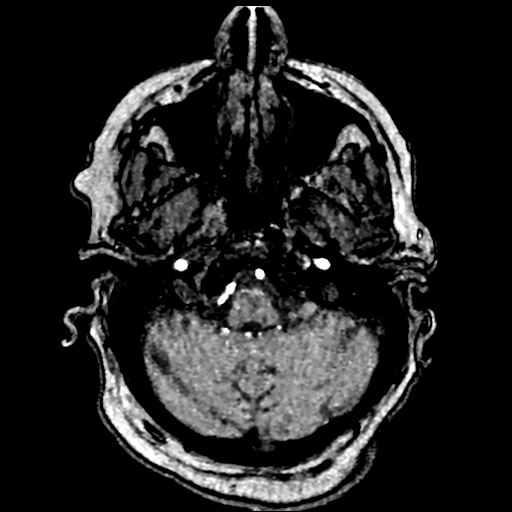
[im 40/205]
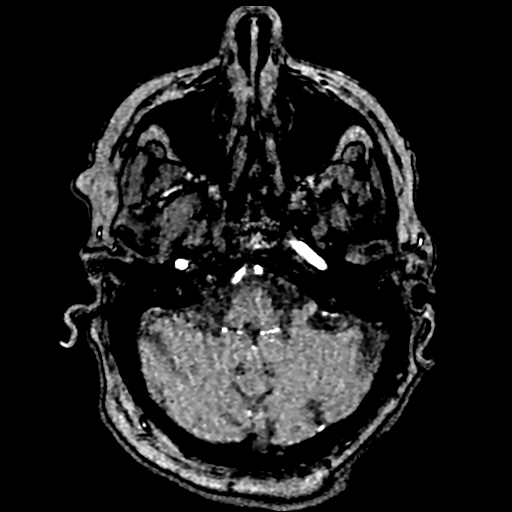
[im 66/205]
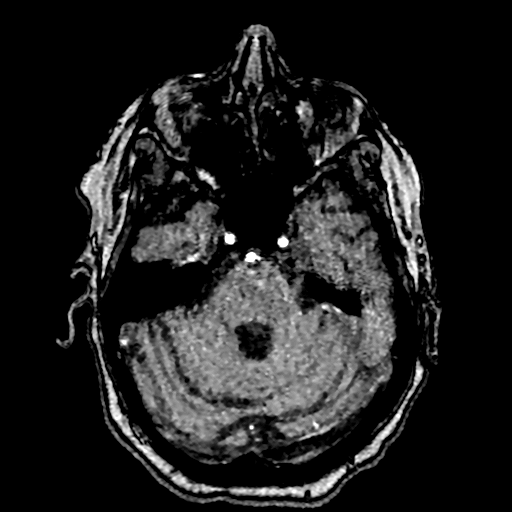
[im 92/205]
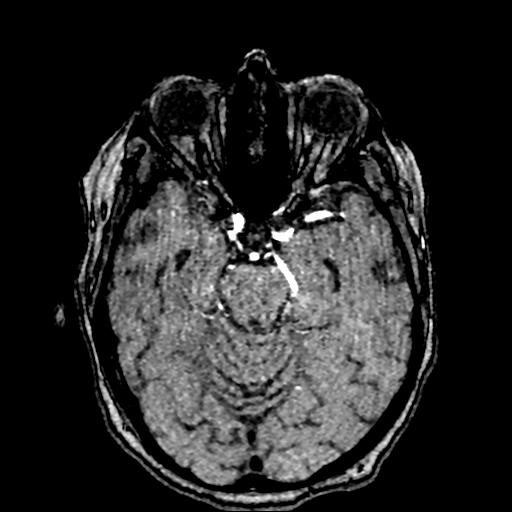
[im 105/205]
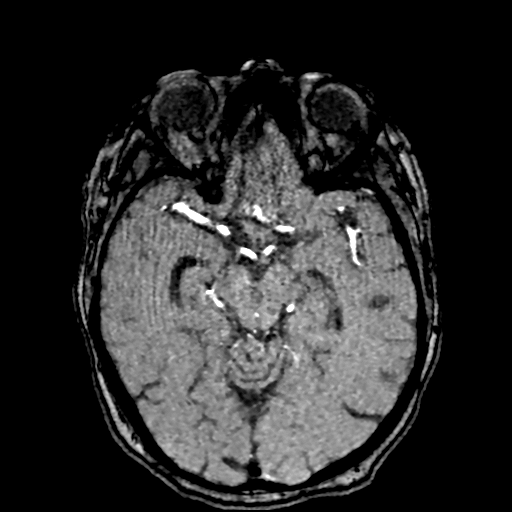
[im 118/205]
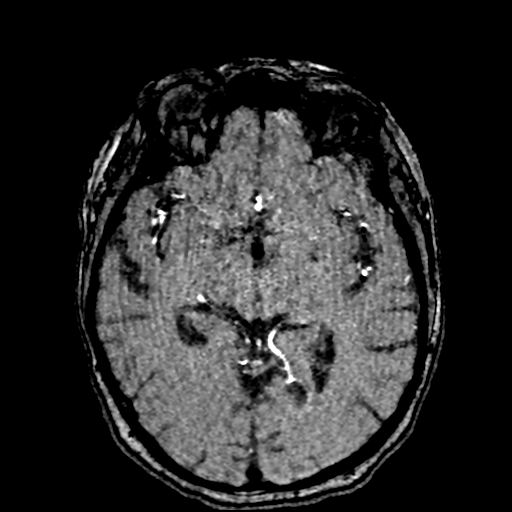
[im 144/205]
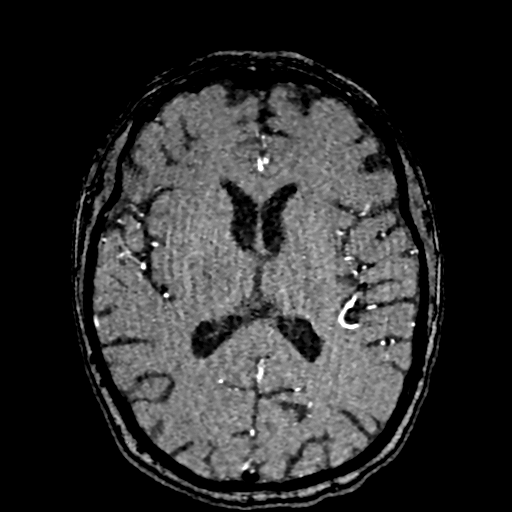
[im 170/205]
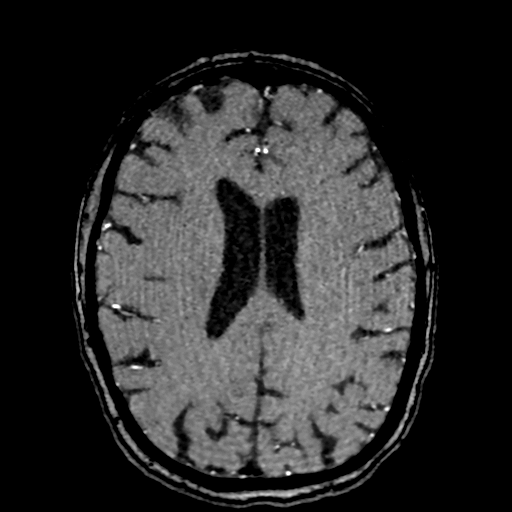
[im 174/205]
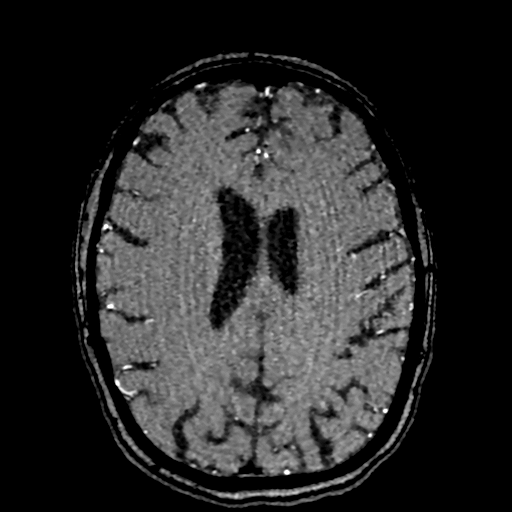
[im 196/205]
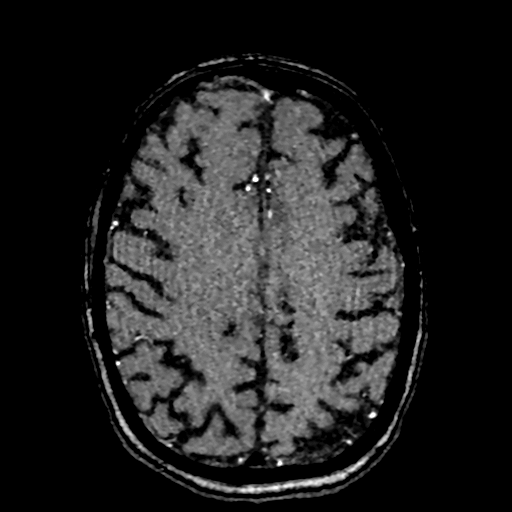

[18 of 48 positions shown; findings below may reference images not displayed]

FINDINGS: MRI HEAD FINDINGS

Brain: Generalized age-related cerebral atrophy. Mild patchy
T2/FLAIR hyperintensity within the periventricular and deep white
matter both cerebral hemispheres most consistent with chronic small
vessel ischemic disease. Mild patchy involvement of the pons.
Superimposed subcentimeter remote lacunar infarct at the posterior
left centrum semi ovale with associated chronic hemosiderin
staining.

1.8 cm acute ischemic lacunar type infarcts seen involving the left
basal ganglia, extending from the left lentiform nucleus towards the
left corona radiata (series 7, image 19). No associated hemorrhage
or mass effect. No other evidence for acute or subacute ischemia.
Gray-white matter differentiation otherwise maintained. No other
evidence for acute or chronic intracranial hemorrhage.

No mass lesion, midline shift or mass effect. No hydrocephalus or
extra-axial fluid collection. No made of an empty sella. Midline
structures intact.

Vascular: Major intracranial vascular flow voids are maintained.

Skull and upper cervical spine: Craniocervical junction normal. Bone
marrow signal intensity within normal limits. No scalp soft tissue
abnormality.

Sinuses/Orbits: Patient status post bilateral ocular lens
replacement. Paranasal sinuses are clear. No mastoid effusion. Inner
ear structures grossly normal.

Other: None.

MRA HEAD FINDINGS

ANTERIOR CIRCULATION:

Examination mildly degraded by motion artifact.

Visualized distal cervical segments of the internal carotid arteries
are patent with symmetric antegrade flow. Petrous segments widely
patent. Scattered atheromatous irregularity within the carotid
siphons without high-grade stenosis. A1 segments patent bilaterally.
Normal anterior communicating artery complex. Anterior cerebral
arteries patent to their distal aspects without stenosis. No M1
stenosis or occlusion. Normal MCA bifurcations. Possible
short-segment moderate to severe proximal right M2 stenosis (series
1671, image 9). MCA branches otherwise well perfused distally and
are fairly symmetric in nature.

POSTERIOR CIRCULATION:

Both vertebral arteries patent to the vertebrobasilar junction
without stenosis. Left vertebral artery slightly dominant. Right
PICA patent. Left PICA not definitely seen. Basilar patent to its
distal aspect without stenosis. Superior cerebral arteries patent
bilaterally. Both PCAs supplied via the basilar as well as small
bilateral posterior communicating arteries. Both PCAs well perfused
to their distal aspects without stenosis.

No intracranial aneurysm.
IMPRESSION: MRI HEAD IMPRESSION:

1. 1.8 cm acute ischemic nonhemorrhagic lacunar type infarct
involving the left basal ganglia.
2. Underlying age-related cerebral atrophy with mild chronic small
vessel ischemic disease.

MRA HEAD IMPRESSION:

1. Negative intracranial MRA for large vessel occlusion.
2. Mild intracranial atherosclerotic change for age. No proximal
high-grade or correctable stenosis identified.
